# Patient Record
Sex: Male | Born: 1960 | Race: White | Hispanic: No | Marital: Married | Smoking: Former smoker
Health system: Southern US, Community
[De-identification: ages and names within clinical notes are randomized; demographics above are authoritative.]

## PROBLEM LIST (undated history)

## (undated) DIAGNOSIS — K219 Gastro-esophageal reflux disease without esophagitis: Secondary | ICD-10-CM

## (undated) DIAGNOSIS — G4730 Sleep apnea, unspecified: Secondary | ICD-10-CM

## (undated) DIAGNOSIS — J189 Pneumonia, unspecified organism: Secondary | ICD-10-CM

## (undated) DIAGNOSIS — E119 Type 2 diabetes mellitus without complications: Secondary | ICD-10-CM

## (undated) DIAGNOSIS — R7303 Prediabetes: Secondary | ICD-10-CM

## (undated) DIAGNOSIS — G4700 Insomnia, unspecified: Secondary | ICD-10-CM

## (undated) DIAGNOSIS — Z87891 Personal history of nicotine dependence: Secondary | ICD-10-CM

## (undated) DIAGNOSIS — R739 Hyperglycemia, unspecified: Secondary | ICD-10-CM

## (undated) DIAGNOSIS — M549 Dorsalgia, unspecified: Secondary | ICD-10-CM

## (undated) DIAGNOSIS — R7989 Other specified abnormal findings of blood chemistry: Secondary | ICD-10-CM

## (undated) DIAGNOSIS — C801 Malignant (primary) neoplasm, unspecified: Secondary | ICD-10-CM

## (undated) DIAGNOSIS — I10 Essential (primary) hypertension: Secondary | ICD-10-CM

## (undated) HISTORY — DX: Type 2 diabetes mellitus without complications: E11.9

## (undated) HISTORY — DX: Hyperglycemia, unspecified: R73.9

## (undated) HISTORY — DX: Insomnia, unspecified: G47.00

## (undated) HISTORY — PX: TONSILLECTOMY: SUR1361

## (undated) HISTORY — DX: Other specified abnormal findings of blood chemistry: R79.89

## (undated) HISTORY — PX: NO PAST SURGERIES: SHX2092

---

## 2000-05-22 ENCOUNTER — Ambulatory Visit (HOSPITAL_COMMUNITY): Admission: RE | Admit: 2000-05-22 | Discharge: 2000-05-22 | Payer: Self-pay | Admitting: Orthopedic Surgery

## 2000-05-22 ENCOUNTER — Encounter: Payer: Self-pay | Admitting: Orthopedic Surgery

## 2005-01-21 ENCOUNTER — Ambulatory Visit: Payer: Self-pay | Admitting: Family Medicine

## 2005-02-06 ENCOUNTER — Ambulatory Visit: Payer: Self-pay | Admitting: Family Medicine

## 2005-03-08 ENCOUNTER — Ambulatory Visit: Payer: Self-pay | Admitting: Family Medicine

## 2005-03-12 ENCOUNTER — Ambulatory Visit: Payer: Self-pay | Admitting: Family Medicine

## 2015-09-13 DIAGNOSIS — H5010 Unspecified exotropia: Secondary | ICD-10-CM

## 2015-09-13 DIAGNOSIS — H50112 Monocular exotropia, left eye: Secondary | ICD-10-CM

## 2015-09-13 DIAGNOSIS — H31013 Macula scars of posterior pole (postinflammatory) (post-traumatic), bilateral: Secondary | ICD-10-CM | POA: Insufficient documentation

## 2015-09-13 HISTORY — DX: Macula scars of posterior pole (postinflammatory) (post-traumatic), bilateral: H31.013

## 2015-09-13 HISTORY — DX: Monocular exotropia, left eye: H50.112

## 2015-09-13 HISTORY — DX: Unspecified exotropia: H50.10

## 2019-02-23 DIAGNOSIS — M5416 Radiculopathy, lumbar region: Secondary | ICD-10-CM

## 2019-02-23 HISTORY — DX: Radiculopathy, lumbar region: M54.16

## 2019-08-06 ENCOUNTER — Encounter: Payer: Self-pay | Admitting: Cardiology

## 2019-08-09 LAB — PROTIME-INR

## 2019-08-13 DIAGNOSIS — F4329 Adjustment disorder with other symptoms: Secondary | ICD-10-CM

## 2019-08-13 HISTORY — DX: Adjustment disorder with other symptoms: F43.29

## 2019-10-14 DIAGNOSIS — J309 Allergic rhinitis, unspecified: Secondary | ICD-10-CM

## 2019-10-14 HISTORY — DX: Allergic rhinitis, unspecified: J30.9

## 2019-10-22 ENCOUNTER — Inpatient Hospital Stay (HOSPITAL_COMMUNITY)
Admission: EM | Admit: 2019-10-22 | Discharge: 2019-10-26 | DRG: 641 | Disposition: A | Discharge status: Home / Self Care | Payer: Self-pay | Attending: Internal Medicine | Admitting: Internal Medicine

## 2019-10-22 DIAGNOSIS — N131 Hydronephrosis with ureteral stricture, not elsewhere classified: Secondary | ICD-10-CM | POA: Diagnosis present

## 2019-10-22 DIAGNOSIS — M1990 Unspecified osteoarthritis, unspecified site: Secondary | ICD-10-CM | POA: Diagnosis present

## 2019-10-22 DIAGNOSIS — F419 Anxiety disorder, unspecified: Secondary | ICD-10-CM | POA: Diagnosis present

## 2019-10-22 DIAGNOSIS — E872 Acidosis, unspecified: Secondary | ICD-10-CM

## 2019-10-22 DIAGNOSIS — R000 Tachycardia, unspecified: Secondary | ICD-10-CM | POA: Diagnosis present

## 2019-10-22 DIAGNOSIS — K047 Periapical abscess without sinus: Secondary | ICD-10-CM

## 2019-10-22 DIAGNOSIS — R42 Dizziness and giddiness: Secondary | ICD-10-CM | POA: Diagnosis present

## 2019-10-22 DIAGNOSIS — Z882 Allergy status to sulfonamides status: Secondary | ICD-10-CM

## 2019-10-22 DIAGNOSIS — R6510 Systemic inflammatory response syndrome (SIRS) of non-infectious origin without acute organ dysfunction: Secondary | ICD-10-CM | POA: Diagnosis present

## 2019-10-22 DIAGNOSIS — E876 Hypokalemia: Secondary | ICD-10-CM | POA: Diagnosis present

## 2019-10-22 DIAGNOSIS — K029 Dental caries, unspecified: Secondary | ICD-10-CM | POA: Diagnosis present

## 2019-10-22 DIAGNOSIS — M1712 Unilateral primary osteoarthritis, left knee: Secondary | ICD-10-CM | POA: Diagnosis present

## 2019-10-22 DIAGNOSIS — I10 Essential (primary) hypertension: Secondary | ICD-10-CM | POA: Diagnosis present

## 2019-10-22 DIAGNOSIS — O10019 Pre-existing essential hypertension complicating pregnancy, unspecified trimester: Secondary | ICD-10-CM | POA: Diagnosis present

## 2019-10-22 DIAGNOSIS — Z87891 Personal history of nicotine dependence: Secondary | ICD-10-CM

## 2019-10-22 DIAGNOSIS — Z23 Encounter for immunization: Secondary | ICD-10-CM

## 2019-10-22 DIAGNOSIS — T502X5A Adverse effect of carbonic-anhydrase inhibitors, benzothiadiazides and other diuretics, initial encounter: Secondary | ICD-10-CM | POA: Diagnosis present

## 2019-10-22 DIAGNOSIS — Z79899 Other long term (current) drug therapy: Secondary | ICD-10-CM

## 2019-10-22 DIAGNOSIS — Z880 Allergy status to penicillin: Secondary | ICD-10-CM

## 2019-10-22 DIAGNOSIS — Z20822 Contact with and (suspected) exposure to covid-19: Secondary | ICD-10-CM | POA: Diagnosis present

## 2019-10-22 HISTORY — DX: Dorsalgia, unspecified: M54.9

## 2019-10-22 HISTORY — DX: Essential (primary) hypertension: I10

## 2019-10-22 HISTORY — DX: Personal history of nicotine dependence: Z87.891

## 2019-10-23 ENCOUNTER — Observation Stay (HOSPITAL_COMMUNITY): Payer: Self-pay

## 2019-10-23 ENCOUNTER — Observation Stay (HOSPITAL_BASED_OUTPATIENT_CLINIC_OR_DEPARTMENT_OTHER): Payer: Self-pay

## 2019-10-23 ENCOUNTER — Emergency Department (HOSPITAL_COMMUNITY): Payer: Self-pay

## 2019-10-23 ENCOUNTER — Other Ambulatory Visit: Payer: Self-pay

## 2019-10-23 ENCOUNTER — Encounter (HOSPITAL_COMMUNITY): Payer: Self-pay | Admitting: Internal Medicine

## 2019-10-23 DIAGNOSIS — O10019 Pre-existing essential hypertension complicating pregnancy, unspecified trimester: Secondary | ICD-10-CM | POA: Diagnosis present

## 2019-10-23 DIAGNOSIS — R6510 Systemic inflammatory response syndrome (SIRS) of non-infectious origin without acute organ dysfunction: Secondary | ICD-10-CM | POA: Diagnosis present

## 2019-10-23 DIAGNOSIS — R42 Dizziness and giddiness: Secondary | ICD-10-CM

## 2019-10-23 DIAGNOSIS — R002 Palpitations: Secondary | ICD-10-CM

## 2019-10-23 DIAGNOSIS — E872 Acidosis, unspecified: Secondary | ICD-10-CM | POA: Diagnosis present

## 2019-10-23 DIAGNOSIS — F419 Anxiety disorder, unspecified: Secondary | ICD-10-CM

## 2019-10-23 DIAGNOSIS — M1990 Unspecified osteoarthritis, unspecified site: Secondary | ICD-10-CM | POA: Diagnosis present

## 2019-10-23 DIAGNOSIS — E876 Hypokalemia: Secondary | ICD-10-CM

## 2019-10-23 DIAGNOSIS — R000 Tachycardia, unspecified: Secondary | ICD-10-CM

## 2019-10-23 DIAGNOSIS — M1711 Unilateral primary osteoarthritis, right knee: Secondary | ICD-10-CM

## 2019-10-23 DIAGNOSIS — I10 Essential (primary) hypertension: Secondary | ICD-10-CM | POA: Diagnosis present

## 2019-10-23 HISTORY — DX: Anxiety disorder, unspecified: F41.9

## 2019-10-23 HISTORY — DX: Dizziness and giddiness: R42

## 2019-10-23 HISTORY — DX: Acidosis: E87.2

## 2019-10-23 HISTORY — DX: Tachycardia, unspecified: R00.0

## 2019-10-23 HISTORY — DX: Hypokalemia: E87.6

## 2019-10-23 HISTORY — DX: Systemic inflammatory response syndrome (sirs) of non-infectious origin without acute organ dysfunction: R65.10

## 2019-10-23 HISTORY — DX: Pre-existing essential hypertension complicating pregnancy, unspecified trimester: O10.019

## 2019-10-23 HISTORY — DX: Acidosis, unspecified: E87.20

## 2019-10-23 HISTORY — DX: Essential (primary) hypertension: I10

## 2019-10-23 HISTORY — DX: Unspecified osteoarthritis, unspecified site: M19.90

## 2019-10-23 LAB — COMPREHENSIVE METABOLIC PANEL
ALT: 63 U/L — ABNORMAL HIGH (ref 0–44)
AST: 35 U/L (ref 15–41)
Albumin: 4.4 g/dL (ref 3.5–5.0)
Alkaline Phosphatase: 71 U/L (ref 38–126)
Anion gap: 16 — ABNORMAL HIGH (ref 5–15)
BUN: 13 mg/dL (ref 6–20)
CO2: 19 mmol/L — ABNORMAL LOW (ref 22–32)
Calcium: 10 mg/dL (ref 8.9–10.3)
Chloride: 102 mmol/L (ref 98–111)
Creatinine, Ser: 1.24 mg/dL (ref 0.61–1.24)
GFR calc Af Amer: >60 mL/min (ref >60)
GFR calc non Af Amer: >60 mL/min (ref >60)
Glucose, Bld: 156 mg/dL — ABNORMAL HIGH (ref 70–99)
Potassium: 3.4 mmol/L — ABNORMAL LOW (ref 3.5–5.1)
Sodium: 137 mmol/L (ref 135–145)
Total Bilirubin: 1 mg/dL (ref 0.3–1.2)
Total Protein: 7.7 g/dL (ref 6.5–8.1)

## 2019-10-23 LAB — TROPONIN I (HIGH SENSITIVITY)
Troponin I (High Sensitivity): 12 ng/L (ref <18)
Troponin I (High Sensitivity): 15 ng/L (ref <18)

## 2019-10-23 LAB — RAPID URINE DRUG SCREEN, HOSP PERFORMED
Amphetamines: NOT DETECTED
Barbiturates: NOT DETECTED
Benzodiazepines: NOT DETECTED
Cocaine: NOT DETECTED
Opiates: NOT DETECTED
Tetrahydrocannabinol: NOT DETECTED

## 2019-10-23 LAB — URINALYSIS, ROUTINE W REFLEX MICROSCOPIC
Bacteria, UA: NONE SEEN
Bilirubin Urine: NEGATIVE
Glucose, UA: NEGATIVE mg/dL
Hgb urine dipstick: NEGATIVE
Ketones, ur: NEGATIVE mg/dL
Leukocytes,Ua: NEGATIVE
Nitrite: NEGATIVE
Protein, ur: 100 mg/dL — AB
Specific Gravity, Urine: 1.009 (ref 1.005–1.030)
pH: 7 (ref 5.0–8.0)

## 2019-10-23 LAB — MRSA PCR SCREENING: MRSA by PCR: NEGATIVE

## 2019-10-23 LAB — C-REACTIVE PROTEIN: CRP: 1.5 mg/dL — ABNORMAL HIGH (ref <1.0)

## 2019-10-23 LAB — D-DIMER, QUANTITATIVE: D-Dimer, Quant: 0.5 ug/mL-FEU (ref 0.00–0.50)

## 2019-10-23 LAB — ECHOCARDIOGRAM COMPLETE
AR max vel: 2.94 cm2
AV Area VTI: 2.77 cm2
AV Area mean vel: 2.81 cm2
AV Mean grad: 3 mmHg
AV Peak grad: 6.3 mmHg
Ao pk vel: 1.25 m/s
S' Lateral: 1.9 cm

## 2019-10-23 LAB — MAGNESIUM: Magnesium: 1.4 mg/dL — ABNORMAL LOW (ref 1.7–2.4)

## 2019-10-23 LAB — CBC
HCT: 50.5 % (ref 39.0–52.0)
Hemoglobin: 17.9 g/dL — ABNORMAL HIGH (ref 13.0–17.0)
MCH: 30.4 pg (ref 26.0–34.0)
MCHC: 35.4 g/dL (ref 30.0–36.0)
MCV: 85.7 fL (ref 80.0–100.0)
Platelets: 350 10*3/uL (ref 150–400)
RBC: 5.89 MIL/uL — ABNORMAL HIGH (ref 4.22–5.81)
RDW: 11.6 % (ref 11.5–15.5)
WBC: 16.4 10*3/uL — ABNORMAL HIGH (ref 4.0–10.5)
nRBC: 0 % (ref 0.0–0.2)

## 2019-10-23 LAB — LACTIC ACID, PLASMA
Lactic Acid, Venous: 3 mmol/L (ref 0.5–1.9)
Lactic Acid, Venous: 2.8 mmol/L (ref 0.5–1.9)

## 2019-10-23 LAB — T4, FREE: Free T4: 0.98 ng/dL (ref 0.61–1.12)

## 2019-10-23 LAB — LIPASE, BLOOD: Lipase: 38 U/L (ref 11–51)

## 2019-10-23 LAB — SALICYLATE LEVEL: Salicylate Lvl: <7 mg/dL — ABNORMAL LOW (ref 7.0–30.0)

## 2019-10-23 LAB — TSH: TSH: 1.305 u[IU]/mL (ref 0.350–4.500)

## 2019-10-23 LAB — HIV ANTIBODY (ROUTINE TESTING W REFLEX): HIV Screen 4th Generation wRfx: NONREACTIVE

## 2019-10-23 LAB — SEDIMENTATION RATE: Sed Rate: 8 mm/hr (ref 0–16)

## 2019-10-23 LAB — SARS CORONAVIRUS 2 BY RT PCR (HOSPITAL ORDER, PERFORMED IN ~~LOC~~ HOSPITAL LAB): SARS Coronavirus 2: NEGATIVE

## 2019-10-23 IMAGING — CT CT ABD-PELV W/ CM
2 of 5 series · 15 of 46 positions shown, 17 images · IV contrast (omnipaque)
Comparison: CT angio chest [DATE]

CLINICAL DATA: Abdominal pain, acute. Near syncope, dizziness and
palpitations.

EXAM:
CT ANGIOGRAPHY CHEST
CT ABDOMEN AND PELVIS WITH CONTRAST
TECHNIQUE: Multidetector CT imaging of the chest was performed using the
standard protocol during bolus administration of intravenous
contrast. Multiplanar CT image reconstructions and MIPs were
obtained to evaluate the vascular anatomy. Multidetector CT imaging
of the abdomen and pelvis was performed using the standard protocol
during bolus administration of intravenous contrast.
CONTRAST:  80mL OMNIPAQUE IOHEXOL 350 MG/ML SOLN

[Series 5: a/p w/ 5mm · axial · 0.80mm/px · z∈[+761,+1211]mm · 12 of 104 slices shown, 14 images]
[im 7/104  soft-tissue]
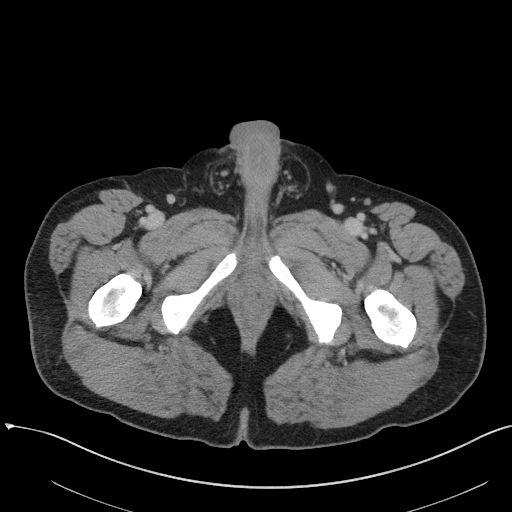
[im 7/104  bone]
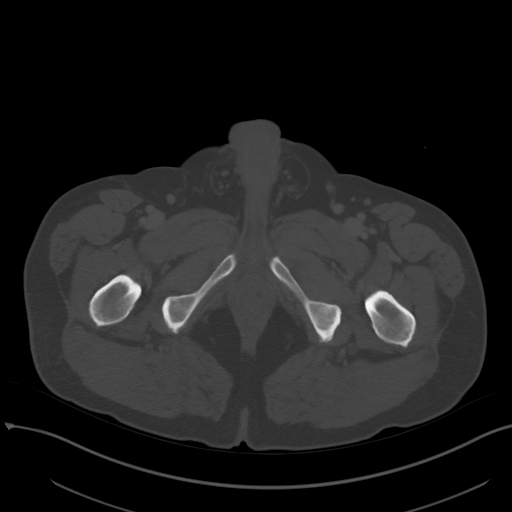
[im 19/104  soft-tissue]
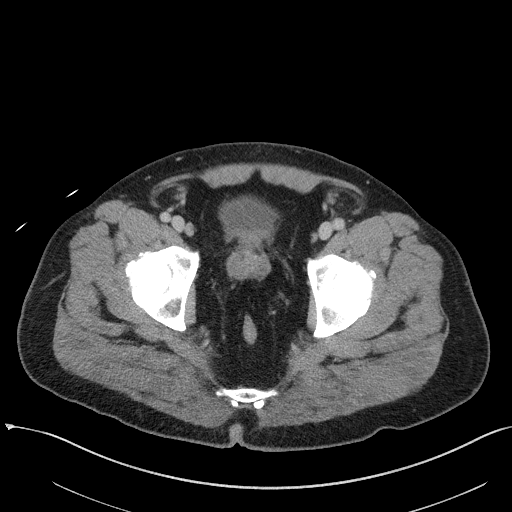
[im 25/104  soft-tissue]
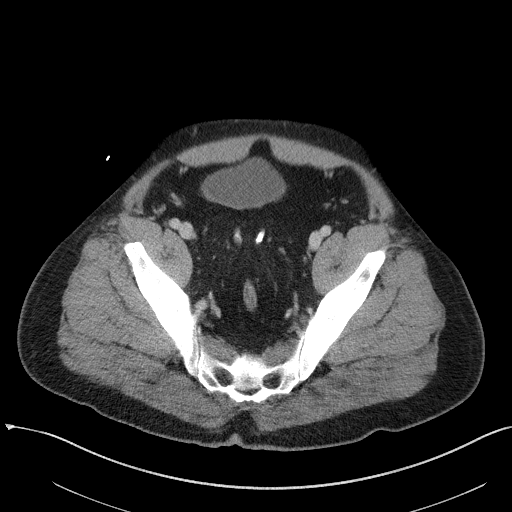
[im 31/104  soft-tissue]
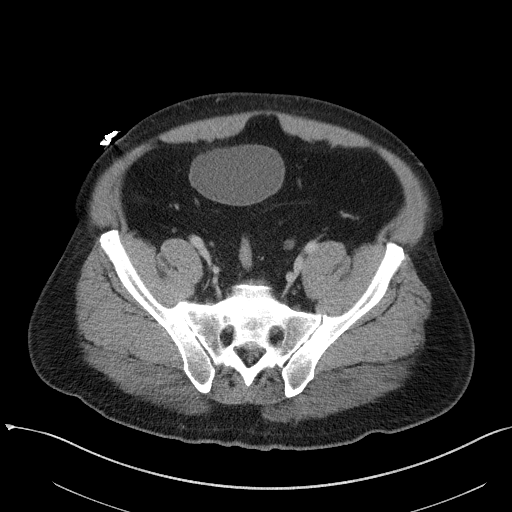
[im 43/104  soft-tissue]
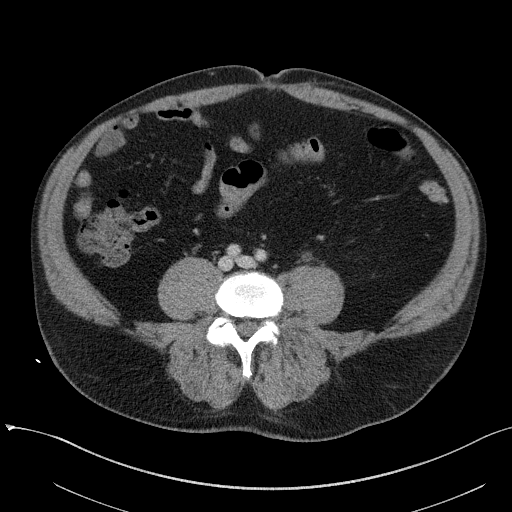
[im 49/104  soft-tissue]
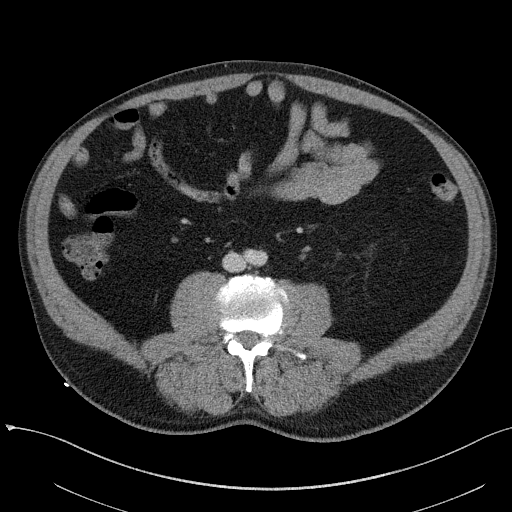
[im 55/104  soft-tissue]
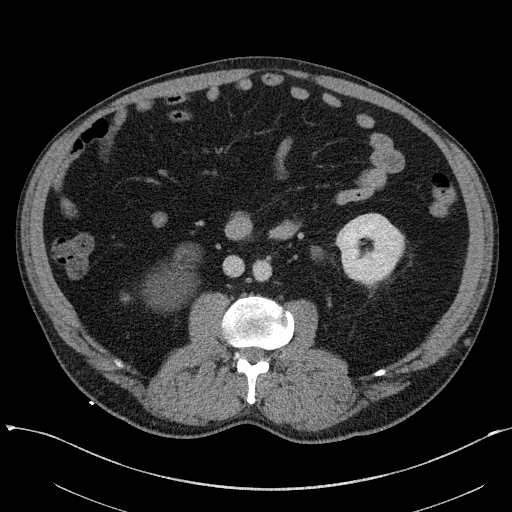
[im 67/104  soft-tissue]
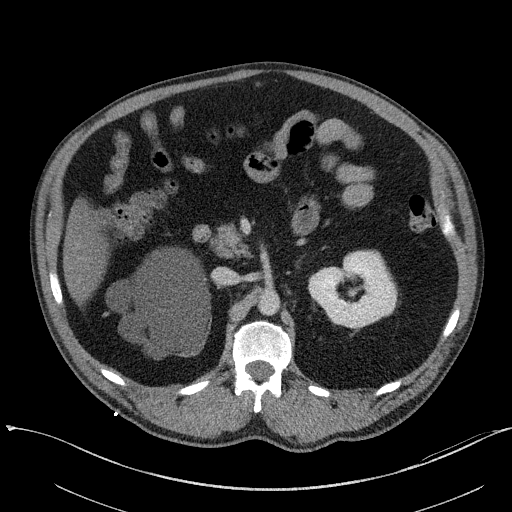
[im 73/104  soft-tissue]
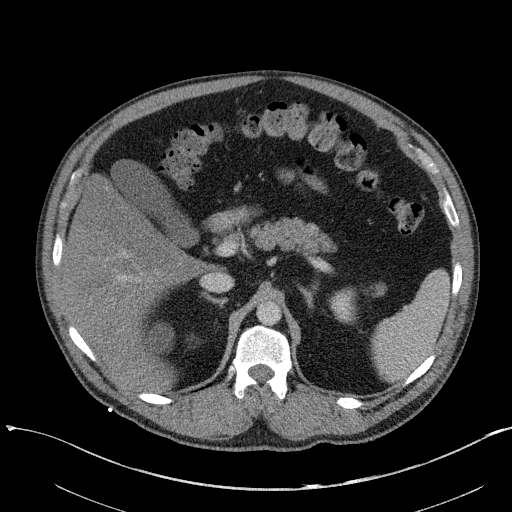
[im 73/104  bone]
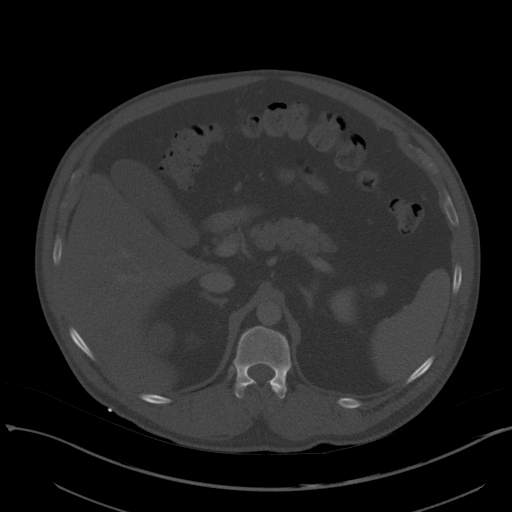
[im 79/104  soft-tissue]
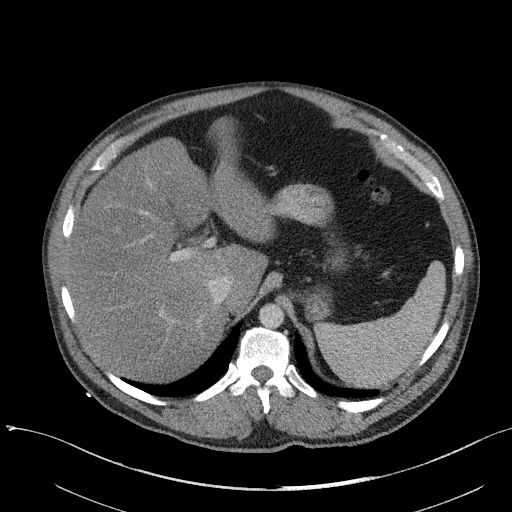
[im 91/104  soft-tissue]
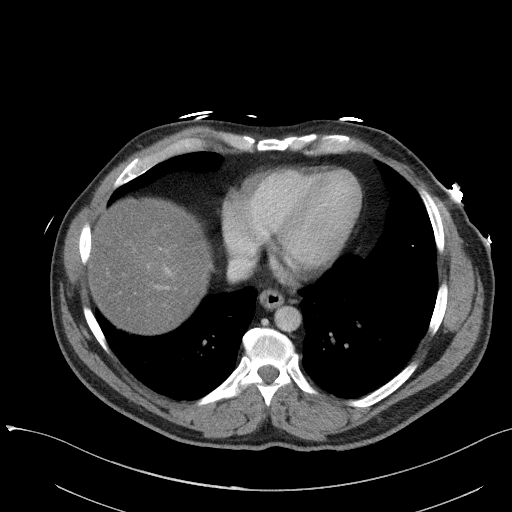
[im 97/104  soft-tissue]
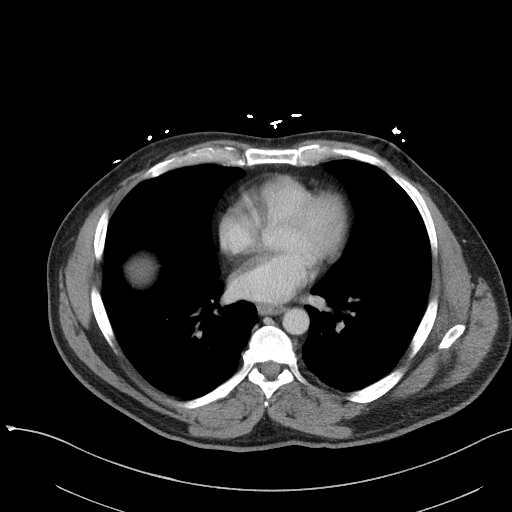

[Series 8: a/p w/ cor · coronal · 0.77mm/px · 3 of 151 slices shown]
[im 51/151  soft-tissue]
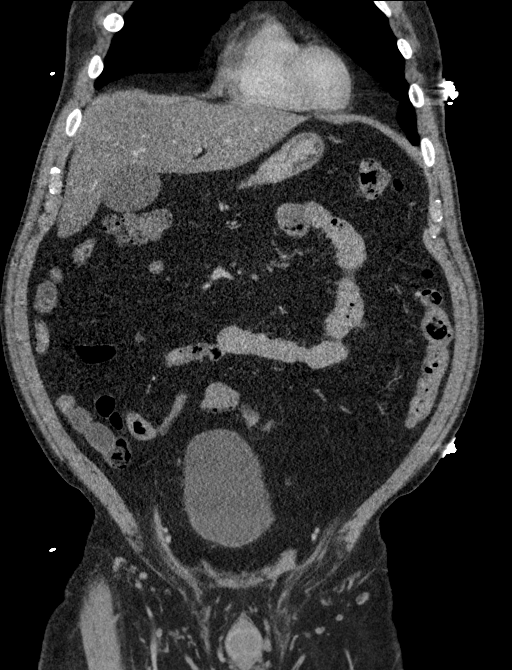
[im 67/151  soft-tissue]
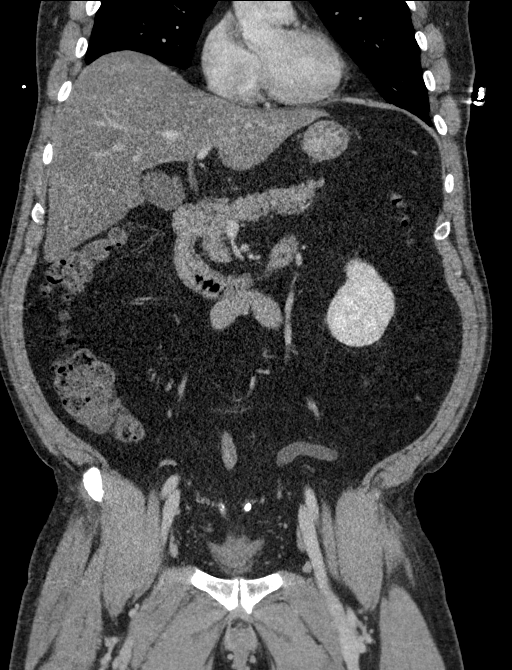
[im 84/151  soft-tissue]
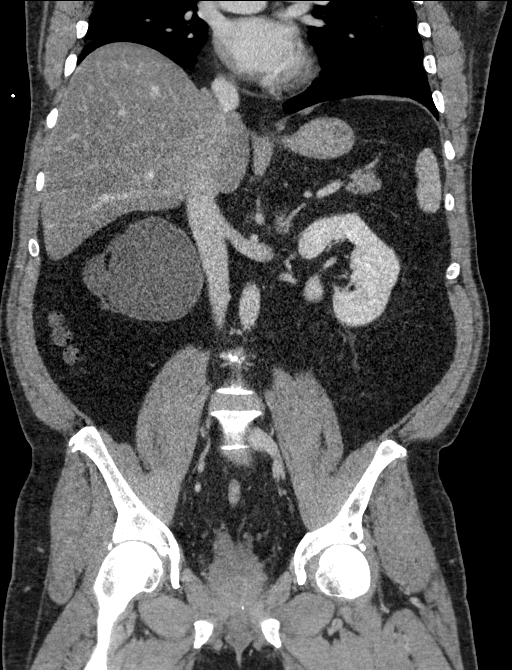

[15 of 46 positions shown; findings below may reference images not displayed]

FINDINGS: CTA CHEST FINDINGS

Cardiovascular: The main pulmonary artery appears patent. No saddle
embolus. No lobar or segmental pulmonary artery filling defects
identified.

Normal heart size.

Mediastinum/Nodes: No enlarged mediastinal, hilar, or axillary lymph
nodes. Thyroid gland, trachea, and esophagus demonstrate no
significant findings.

Lungs/Pleura: No pleural effusion identified. Mild mosaic
attenuation pattern is identified bilaterally. No airspace
consolidation identified.

Musculoskeletal: No acute or suspicious osseous findings.

Review of the MIP images confirms the above findings.

CT ABDOMEN and PELVIS FINDINGS

Hepatobiliary: Hepatic steatosis. No focal liver abnormality
identified. Gallbladder is unremarkable. No bile duct dilatation.

Pancreas: Unremarkable. No pancreatic ductal dilatation or
surrounding inflammatory changes.

Spleen: Normal in size without focal abnormality.

Adrenals/Urinary Tract: Normal appearance of the adrenal glands.
Prominent left kidney extrarenal pelvis noted. No left kidney
hydronephrosis or mass identified. Mild left hydroureter is
identified. No left ureteral lithiasis or mass identified. There are
signs of marked right hydronephrosis to the level of the UPJ with
significant atrophy of normal renal parenchyma. Normal caliber right
ureter. Urinary bladder appears unremarkable.

Stomach/Bowel: The stomach appears nondistended. The appendix is
visualized and appears normal. There is no bowel wall thickening,
inflammation or distension.

Vascular/Lymphatic: Normal appearance of the abdominal aorta. No
aneurysm. No abdominopelvic adenopathy identified.

Reproductive: Prostate is unremarkable.

Other: No free fluid or fluid collections.

Musculoskeletal: No acute or significant osseous findings. Lumbar
degenerative disc disease.

Review of the MIP images confirms the above findings.
IMPRESSION: 1. No evidence for acute pulmonary embolus.
2. Mild mosaic attenuation pattern identified bilaterally which may
reflect small airways disease.
3. Marked right hydronephrosis to the level of the UPJ with
significant atrophy of normal renal parenchyma. Findings are favored
to represent sequelae of chronic UPJ obstruction, which may be
congenital or acquired.
4. Hepatic steatosis.

## 2019-10-23 IMAGING — DX DG CHEST 1V PORT
1 series · 2 of 2 positions shown · non-contrast
Comparison: [DATE]

CLINICAL DATA: Dizziness, palpitations, near syncope

EXAM:
PORTABLE CHEST 1 VIEW

[Series 1: chest ap · 0.14mm/px · 2 of 2 slices shown]
[im 1/2]
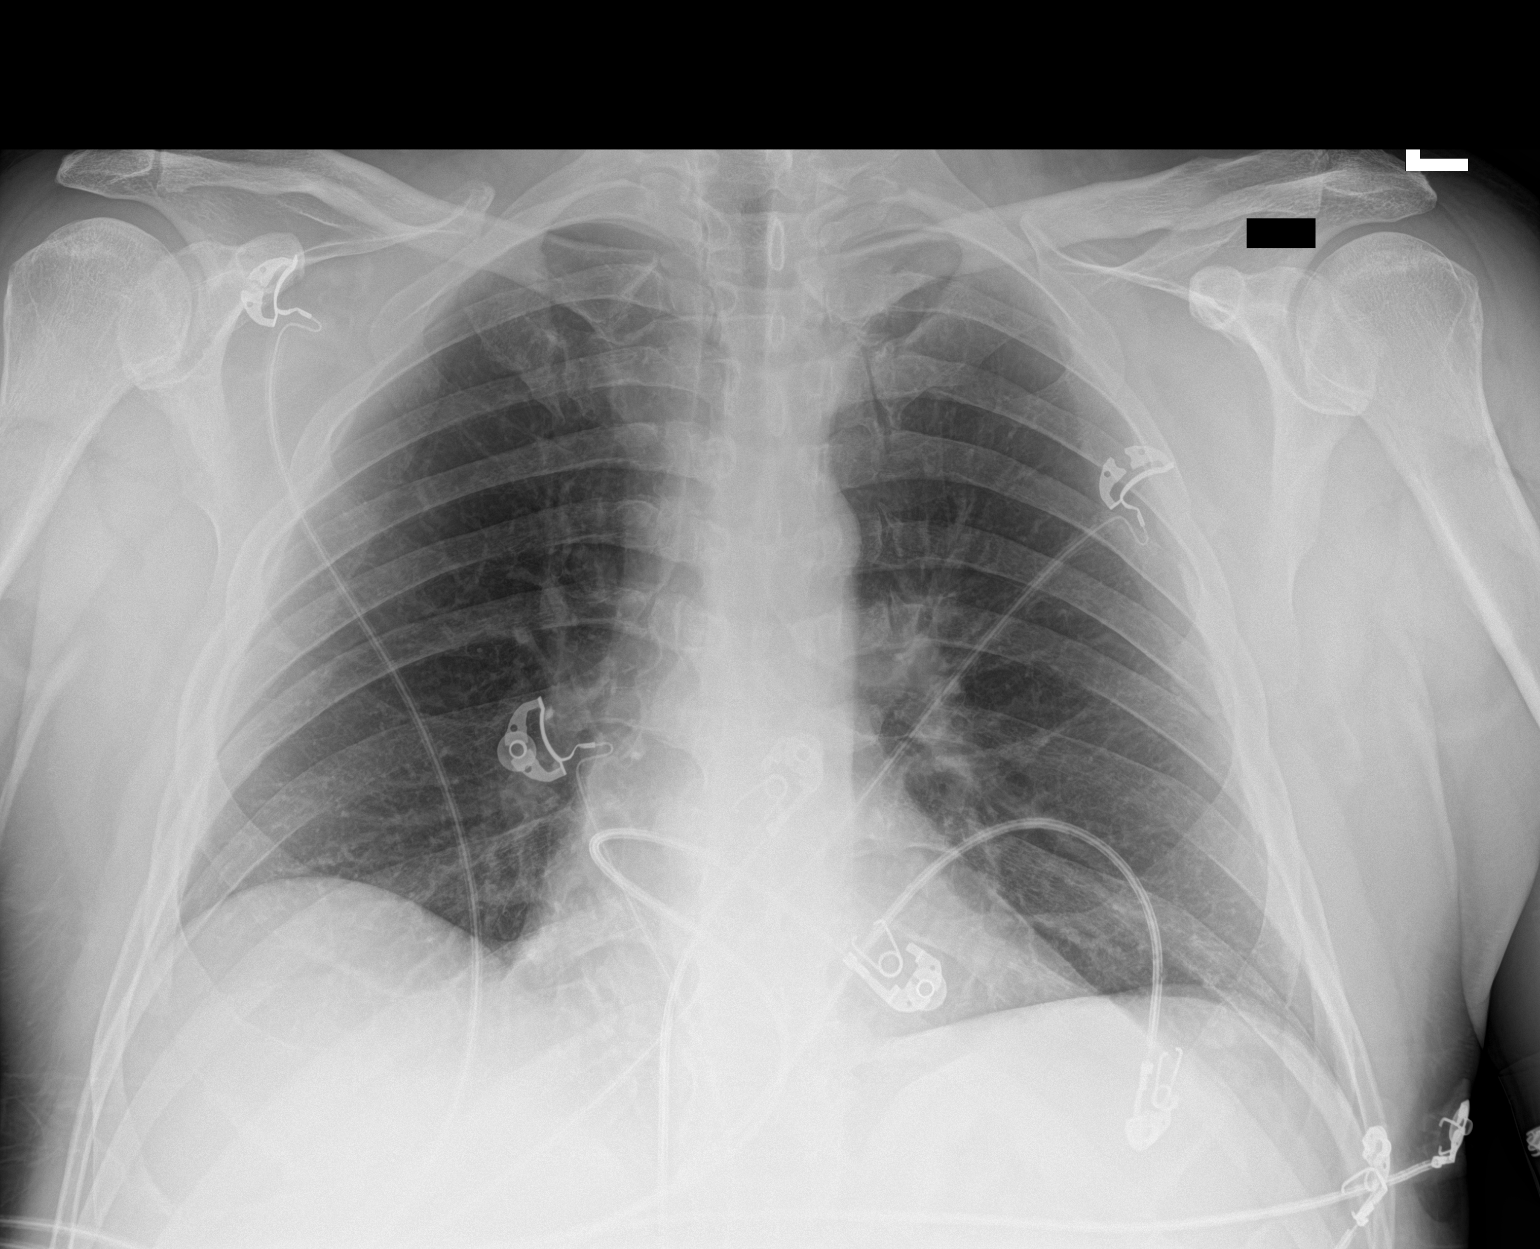
[im 2/2]
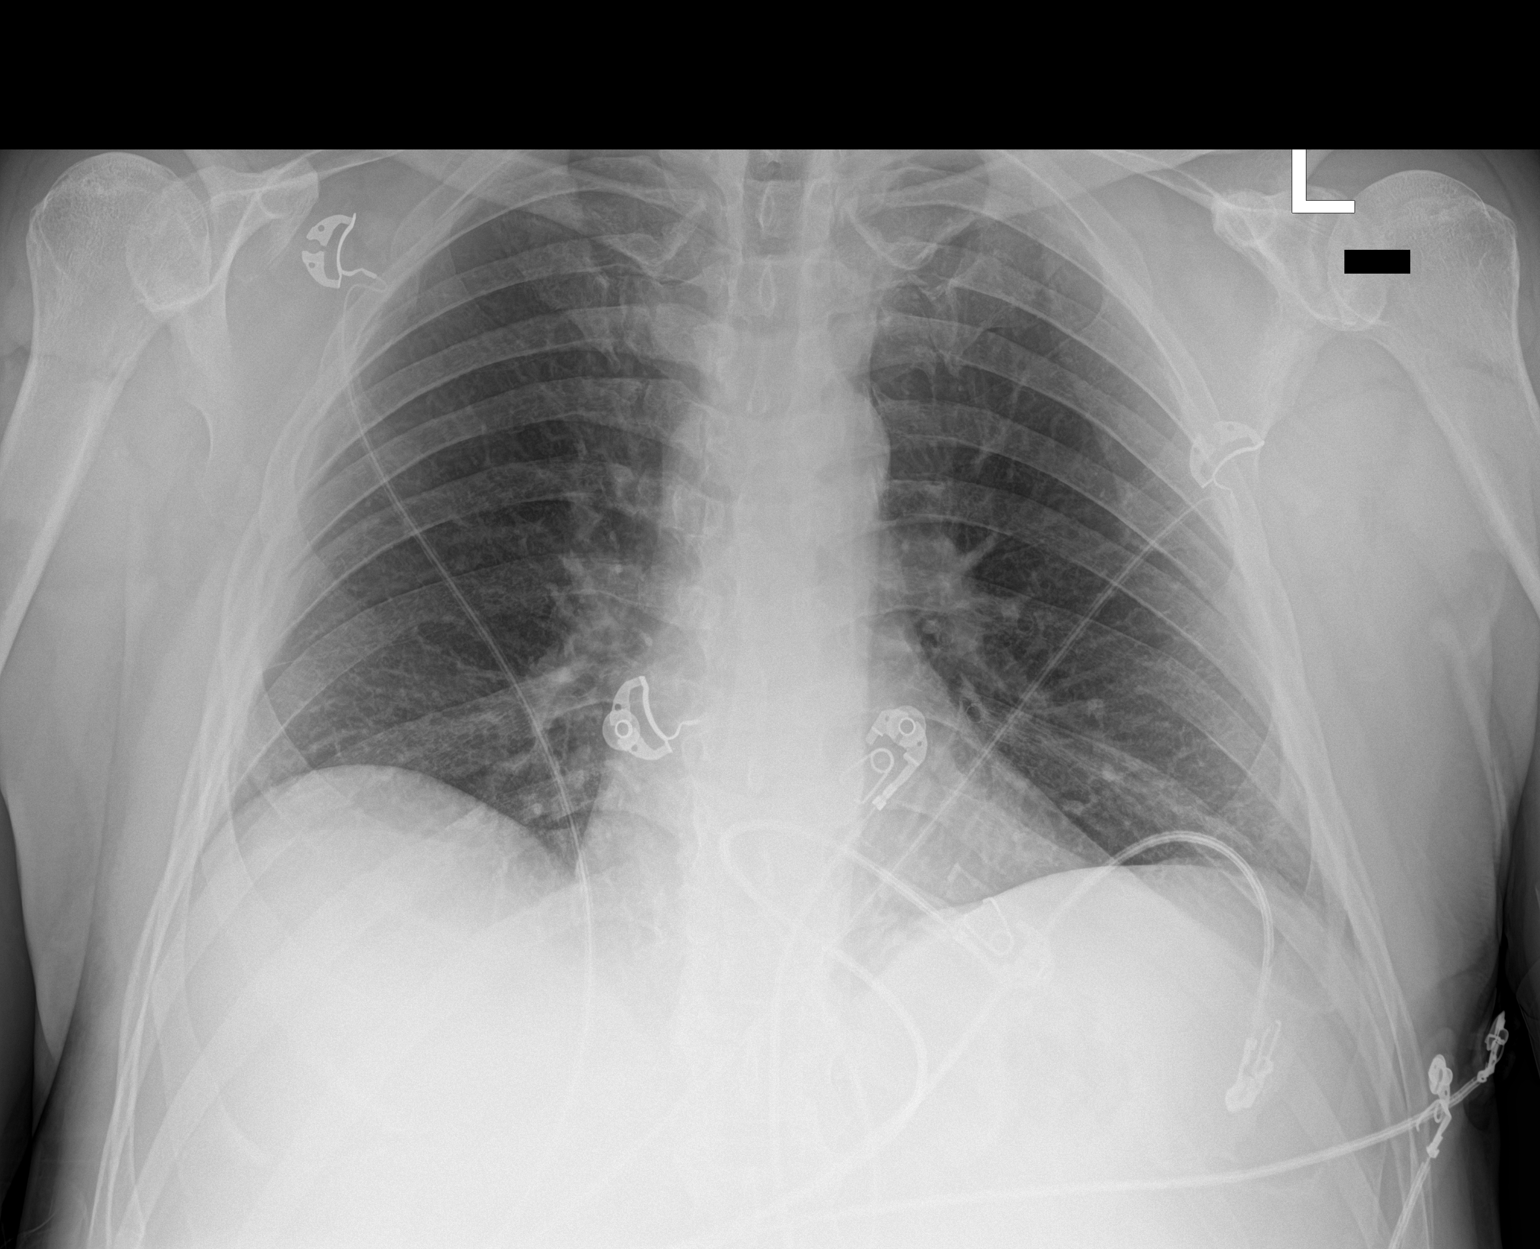

[2 of 2 positions shown; findings below may reference images not displayed]

FINDINGS: Two frontal views of the chest demonstrate an unremarkable cardiac
silhouette. No airspace disease, effusion, or pneumothorax. No acute
bony abnormalities.
IMPRESSION: 1. No acute intrathoracic process.

## 2019-10-23 IMAGING — DX DG ORTHOPANTOGRAM /PANORAMIC
1 series · 1 of 1 positions shown · non-contrast
Comparison: None.

CLINICAL DATA: Dental infection

EXAM:
ORTHOPANTOGRAM/PANORAMIC

[view not recorded]
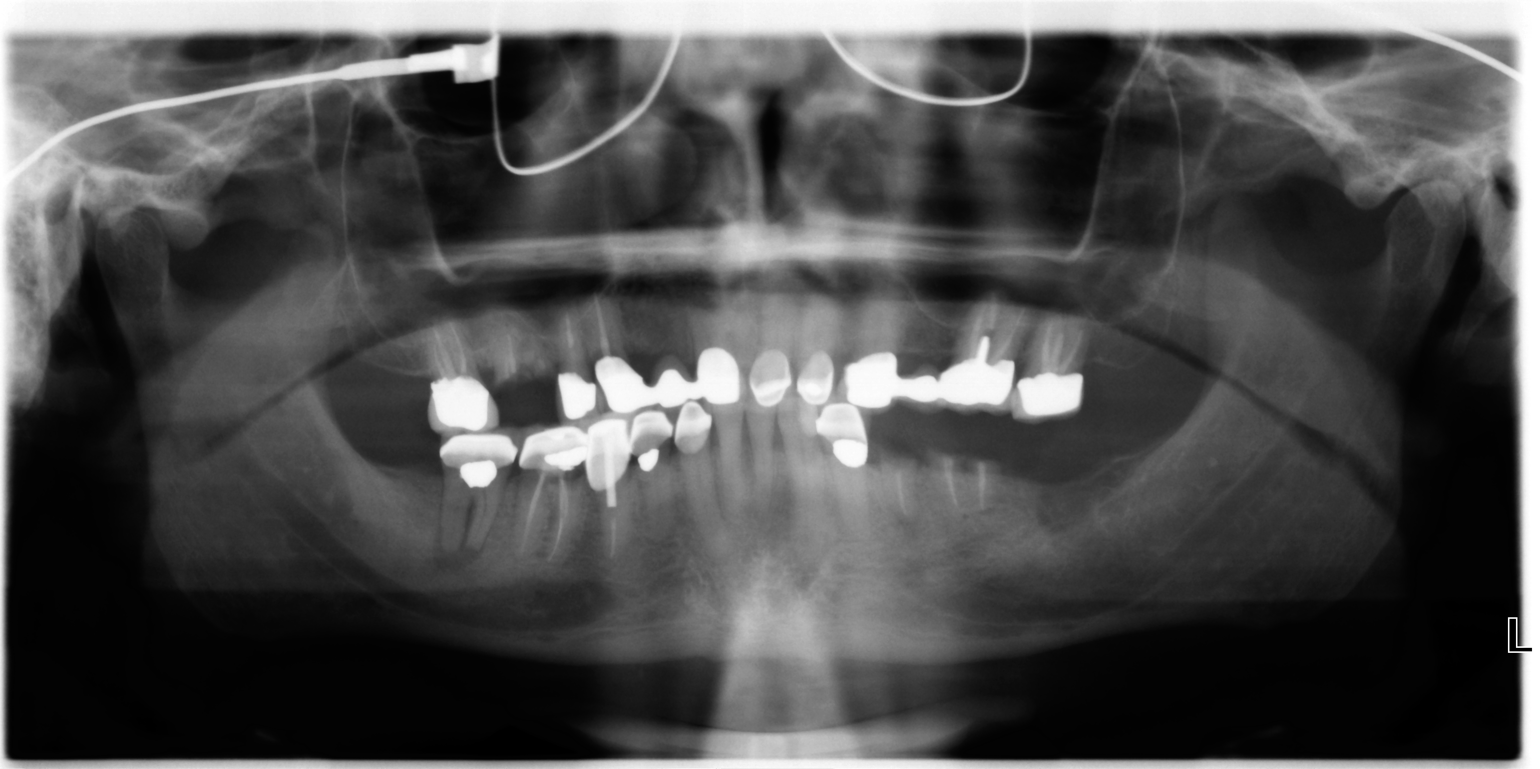

[1 of 1 positions shown; findings below may reference images not displayed]

FINDINGS: Panorex view of the mandible demonstrates erosive changes of the
right upper molar tooth and left lower premolar and molar. No bony
abnormalities.
IMPRESSION: 1. Erosive changes of the right upper molar and left lower premolar
and molar.

## 2019-10-23 IMAGING — CT CT ANGIO CHEST
2 of 6 series · 17 of 36 positions shown · IV contrast (omnipaque)
Comparison: CT angio chest [DATE]

CLINICAL DATA: Abdominal pain, acute. Near syncope, dizziness and
palpitations.

EXAM:
CT ANGIOGRAPHY CHEST
CT ABDOMEN AND PELVIS WITH CONTRAST
TECHNIQUE: Multidetector CT imaging of the chest was performed using the
standard protocol during bolus administration of intravenous
contrast. Multiplanar CT image reconstructions and MIPs were
obtained to evaluate the vascular anatomy. Multidetector CT imaging
of the abdomen and pelvis was performed using the standard protocol
during bolus administration of intravenous contrast.
CONTRAST:  80mL OMNIPAQUE IOHEXOL 350 MG/ML SOLN

[Series 4: pe thins · axial · 0.74mm/px · z∈[+1114,+1346]mm · 16 of 263 slices shown]
[im 15/263  lung]
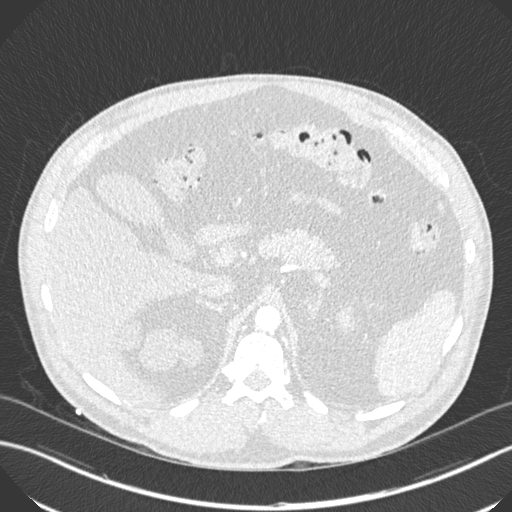
[im 30/263  mediastinal]
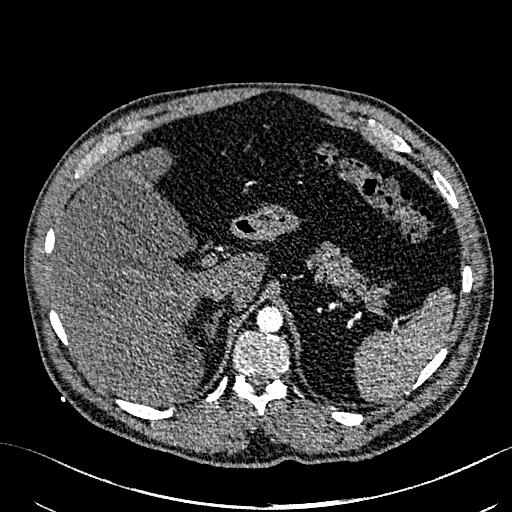
[im 44/263  lung]
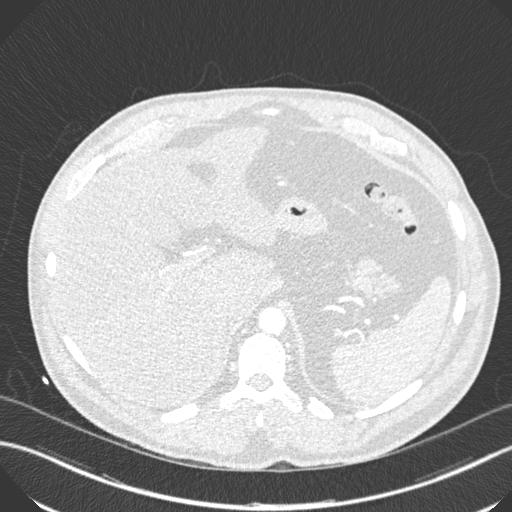
[im 59/263  mediastinal]
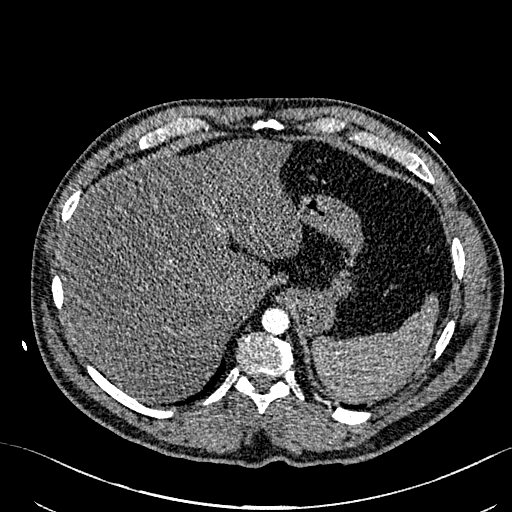
[im 73/263  lung]
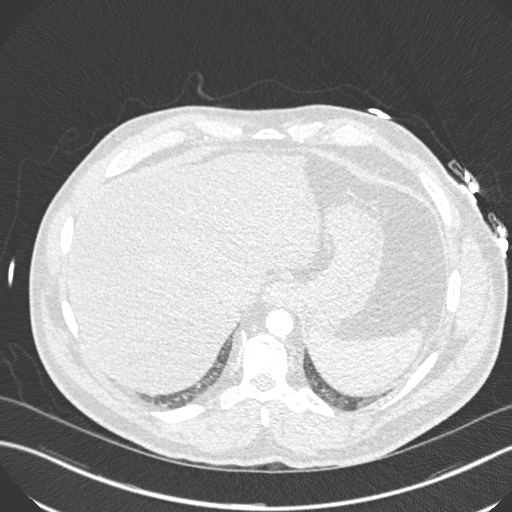
[im 88/263  mediastinal]
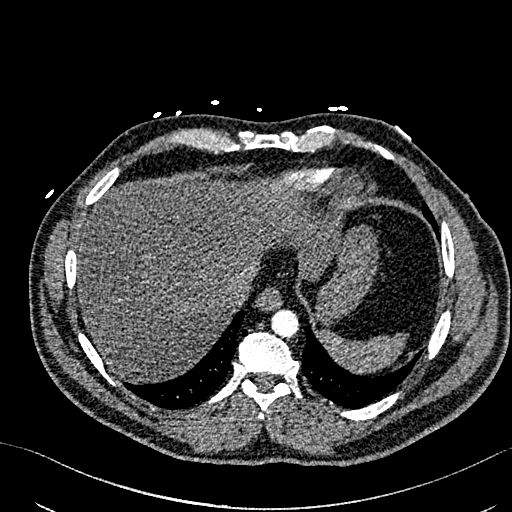
[im 102/263  lung]
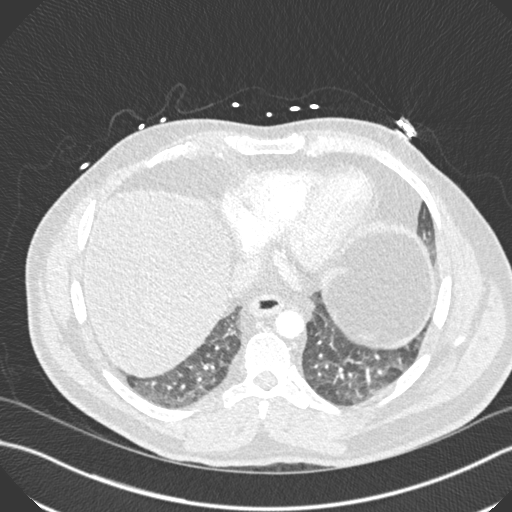
[im 117/263  mediastinal]
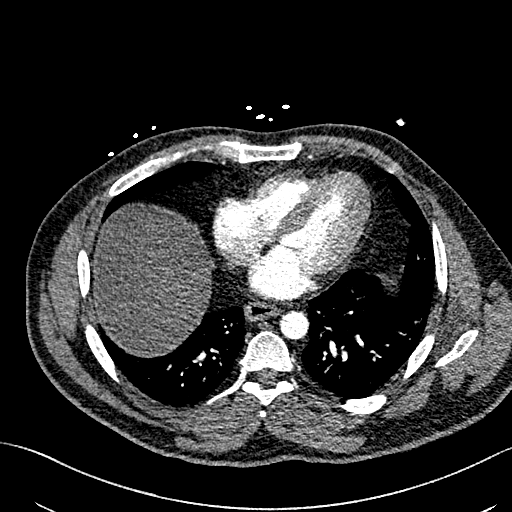
[im 146/263  lung]
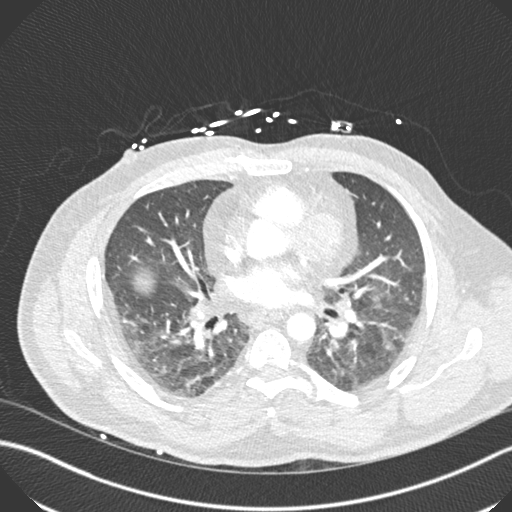
[im 161/263  mediastinal]
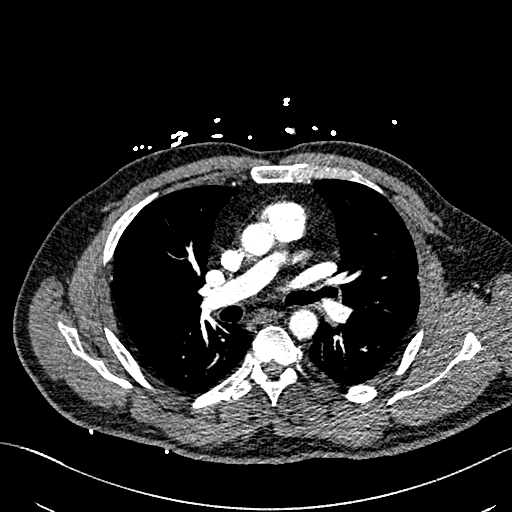
[im 175/263  lung]
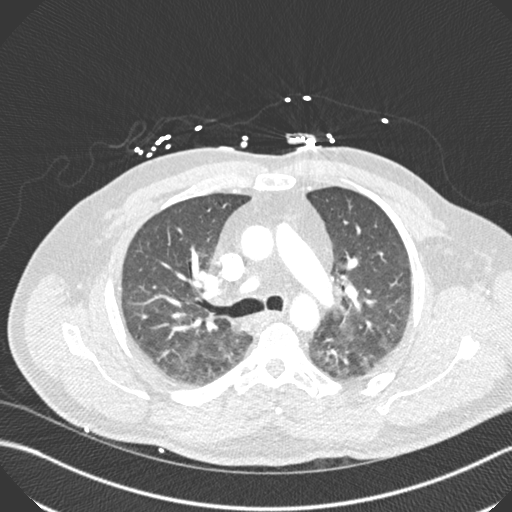
[im 190/263  mediastinal]
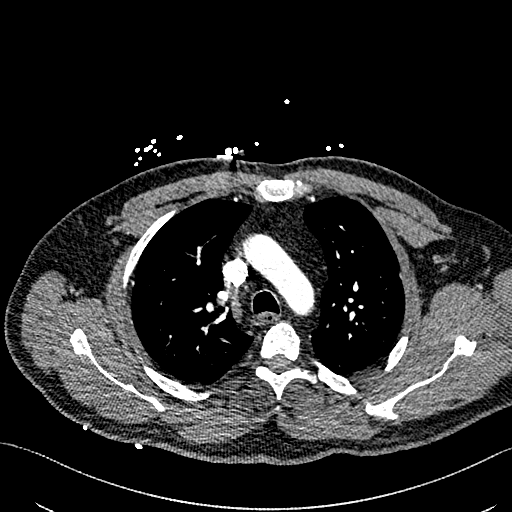
[im 204/263  lung]
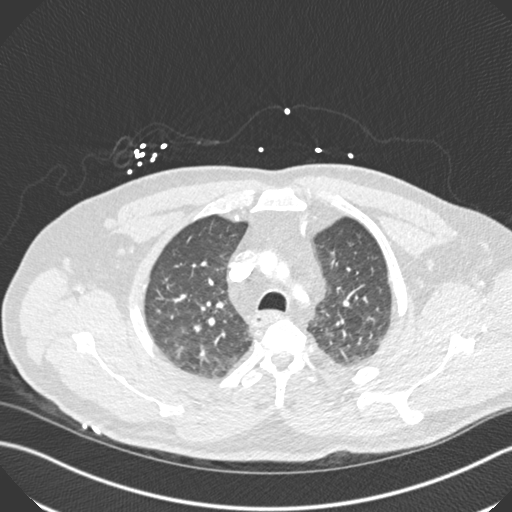
[im 219/263  mediastinal]
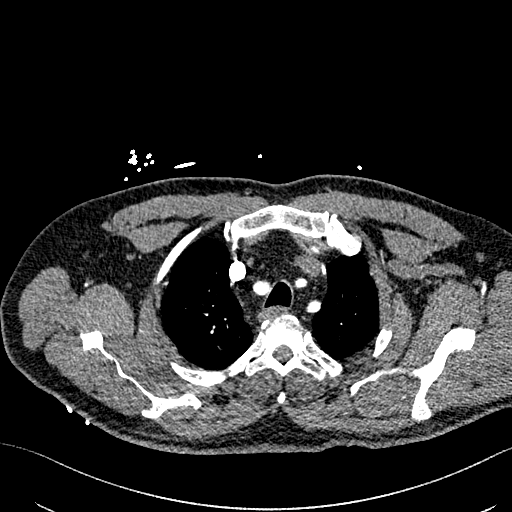
[im 233/263  lung]
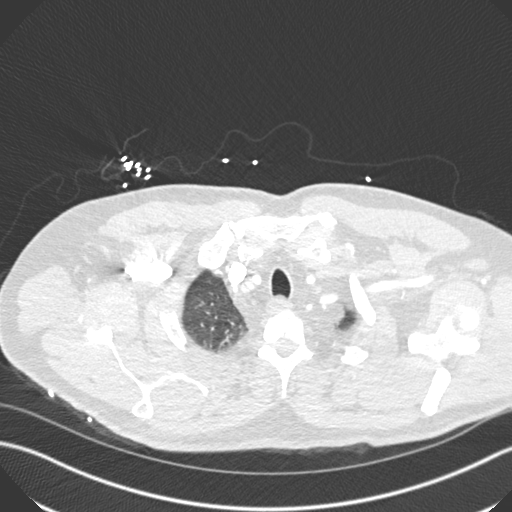
[im 248/263  mediastinal]
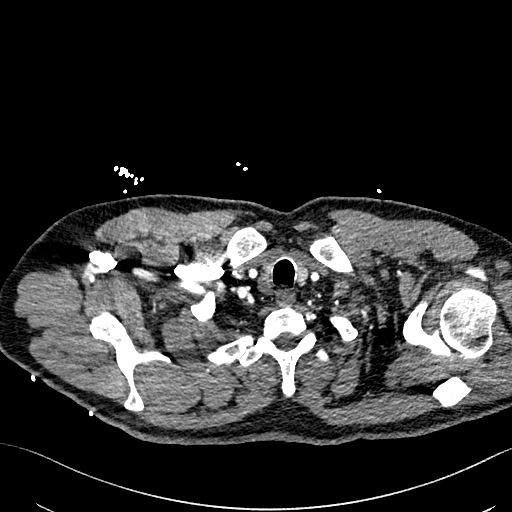

[Series 6: pe 2mm cor · coronal · 0.53mm/px · 1 of 130 slices shown]
[im 65/130  mediastinal]
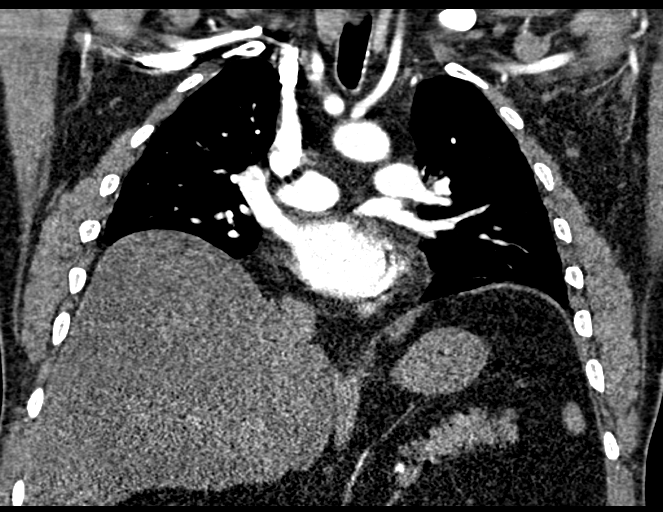

[17 of 36 positions shown; findings below may reference images not displayed]

FINDINGS: CTA CHEST FINDINGS

Cardiovascular: The main pulmonary artery appears patent. No saddle
embolus. No lobar or segmental pulmonary artery filling defects
identified.

Normal heart size.

Mediastinum/Nodes: No enlarged mediastinal, hilar, or axillary lymph
nodes. Thyroid gland, trachea, and esophagus demonstrate no
significant findings.

Lungs/Pleura: No pleural effusion identified. Mild mosaic
attenuation pattern is identified bilaterally. No airspace
consolidation identified.

Musculoskeletal: No acute or suspicious osseous findings.

Review of the MIP images confirms the above findings.

CT ABDOMEN and PELVIS FINDINGS

Hepatobiliary: Hepatic steatosis. No focal liver abnormality
identified. Gallbladder is unremarkable. No bile duct dilatation.

Pancreas: Unremarkable. No pancreatic ductal dilatation or
surrounding inflammatory changes.

Spleen: Normal in size without focal abnormality.

Adrenals/Urinary Tract: Normal appearance of the adrenal glands.
Prominent left kidney extrarenal pelvis noted. No left kidney
hydronephrosis or mass identified. Mild left hydroureter is
identified. No left ureteral lithiasis or mass identified. There are
signs of marked right hydronephrosis to the level of the UPJ with
significant atrophy of normal renal parenchyma. Normal caliber right
ureter. Urinary bladder appears unremarkable.

Stomach/Bowel: The stomach appears nondistended. The appendix is
visualized and appears normal. There is no bowel wall thickening,
inflammation or distension.

Vascular/Lymphatic: Normal appearance of the abdominal aorta. No
aneurysm. No abdominopelvic adenopathy identified.

Reproductive: Prostate is unremarkable.

Other: No free fluid or fluid collections.

Musculoskeletal: No acute or significant osseous findings. Lumbar
degenerative disc disease.

Review of the MIP images confirms the above findings.
IMPRESSION: 1. No evidence for acute pulmonary embolus.
2. Mild mosaic attenuation pattern identified bilaterally which may
reflect small airways disease.
3. Marked right hydronephrosis to the level of the UPJ with
significant atrophy of normal renal parenchyma. Findings are favored
to represent sequelae of chronic UPJ obstruction, which may be
congenital or acquired.
4. Hepatic steatosis.

## 2019-10-23 MED ORDER — IOHEXOL 350 MG/ML SOLN
100.0000 mL | Freq: Once | INTRAVENOUS | Status: AC | PRN
  Administered 2019-10-23: 80 mL via INTRAVENOUS

## 2019-10-23 MED ORDER — GUAIFENESIN ER 600 MG PO TB12
600.0000 mg | ORAL_TABLET | Freq: Two times a day (BID) | ORAL | Status: DC
  Administered 2019-10-24: 600 mg via ORAL
  Filled 2019-10-23 (×4): qty 1

## 2019-10-23 MED ORDER — DICLOFENAC SODIUM 1 % EX GEL
2.0000 g | Freq: Four times a day (QID) | CUTANEOUS | Status: DC
  Administered 2019-10-23 – 2019-10-25 (×8): 2 g via TOPICAL
  Filled 2019-10-23: qty 100

## 2019-10-23 MED ORDER — ACETAMINOPHEN 325 MG PO TABS
650.0000 mg | ORAL_TABLET | Freq: Four times a day (QID) | ORAL | Status: DC | PRN
  Administered 2019-10-23: 650 mg via ORAL
  Filled 2019-10-23: qty 2

## 2019-10-23 MED ORDER — LORAZEPAM 2 MG/ML IJ SOLN
1.0000 mg | Freq: Once | INTRAMUSCULAR | Status: AC
  Administered 2019-10-23: 1 mg via INTRAVENOUS
  Filled 2019-10-23: qty 1

## 2019-10-23 MED ORDER — ZOLPIDEM TARTRATE 5 MG PO TABS
5.0000 mg | ORAL_TABLET | Freq: Every evening | ORAL | Status: DC | PRN
  Administered 2019-10-23 – 2019-10-26 (×3): 5 mg via ORAL
  Filled 2019-10-23 (×3): qty 1

## 2019-10-23 MED ORDER — HYDROXYZINE HCL 25 MG PO TABS
25.0000 mg | ORAL_TABLET | Freq: Three times a day (TID) | ORAL | Status: DC | PRN
  Administered 2019-10-24 – 2019-10-26 (×4): 25 mg via ORAL
  Filled 2019-10-23 (×5): qty 1

## 2019-10-23 MED ORDER — LABETALOL HCL 5 MG/ML IV SOLN
10.0000 mg | Freq: Once | INTRAVENOUS | Status: AC
  Administered 2019-10-23: 10 mg via INTRAVENOUS
  Filled 2019-10-23: qty 4

## 2019-10-23 MED ORDER — SODIUM CHLORIDE 0.9 % IV BOLUS
1000.0000 mL | Freq: Once | INTRAVENOUS | Status: AC
  Administered 2019-10-23: 1000 mL via INTRAVENOUS

## 2019-10-23 MED ORDER — ENOXAPARIN SODIUM 40 MG/0.4ML ~~LOC~~ SOLN
40.0000 mg | SUBCUTANEOUS | Status: DC
  Administered 2019-10-23 – 2019-10-26 (×4): 40 mg via SUBCUTANEOUS
  Filled 2019-10-23 (×4): qty 0.4

## 2019-10-23 MED ORDER — SODIUM CHLORIDE 0.9 % IV SOLN
3.0000 g | Freq: Four times a day (QID) | INTRAVENOUS | Status: DC
  Administered 2019-10-23 – 2019-10-26 (×11): 3 g via INTRAVENOUS
  Filled 2019-10-23 (×4): qty 3
  Filled 2019-10-23: qty 8
  Filled 2019-10-23 (×3): qty 3
  Filled 2019-10-23: qty 8
  Filled 2019-10-23 (×2): qty 3
  Filled 2019-10-23: qty 8
  Filled 2019-10-23 (×2): qty 3

## 2019-10-23 MED ORDER — ONDANSETRON HCL 4 MG/2ML IJ SOLN: 4.0000 mg | Freq: Four times a day (QID) | INTRAMUSCULAR | Status: DC | PRN

## 2019-10-23 MED ORDER — METOPROLOL SUCCINATE ER 25 MG PO TB24: 50.0000 mg | ORAL_TABLET | Freq: Every day | ORAL | Status: DC

## 2019-10-23 MED ORDER — ONDANSETRON HCL 4 MG PO TABS: 4.0000 mg | ORAL_TABLET | Freq: Four times a day (QID) | ORAL | Status: DC | PRN

## 2019-10-23 MED ORDER — SODIUM CHLORIDE 0.9% FLUSH
3.0000 mL | Freq: Two times a day (BID) | INTRAVENOUS | Status: DC
  Administered 2019-10-23 – 2019-10-25 (×5): 3 mL via INTRAVENOUS

## 2019-10-23 MED ORDER — ACETAMINOPHEN 650 MG RE SUPP: 650.0000 mg | Freq: Four times a day (QID) | RECTAL | Status: DC | PRN

## 2019-10-23 MED ORDER — LORATADINE 10 MG PO TABS
10.0000 mg | ORAL_TABLET | Freq: Every day | ORAL | Status: DC
  Administered 2019-10-24 – 2019-10-26 (×3): 10 mg via ORAL
  Filled 2019-10-23 (×3): qty 1

## 2019-10-23 MED ORDER — HYDROMORPHONE HCL 1 MG/ML IJ SOLN
1.0000 mg | Freq: Once | INTRAMUSCULAR | Status: AC
  Administered 2019-10-23: 1 mg via INTRAVENOUS
  Filled 2019-10-23: qty 1

## 2019-10-23 MED ORDER — MAGNESIUM SULFATE 2 GM/50ML IV SOLN
2.0000 g | Freq: Once | INTRAVENOUS | Status: AC
  Administered 2019-10-23: 2 g via INTRAVENOUS
  Filled 2019-10-23: qty 50

## 2019-10-23 MED ORDER — FLUTICASONE PROPIONATE 50 MCG/ACT NA SUSP
1.0000 | Freq: Every day | NASAL | Status: DC
  Administered 2019-10-23 – 2019-10-25 (×3): 1 via NASAL
  Filled 2019-10-23: qty 16

## 2019-10-23 MED ORDER — METOPROLOL SUCCINATE ER 100 MG PO TB24
100.0000 mg | ORAL_TABLET | Freq: Every day | ORAL | Status: DC
  Administered 2019-10-23 – 2019-10-26 (×4): 100 mg via ORAL
  Filled 2019-10-23 (×4): qty 1

## 2019-10-23 NOTE — ED Triage Notes (Signed)
Pt BIB [facility], pt c/o near syncope, dizziness, and palpitations. Pt reports these symptoms have been intermittent x 2 months. Reports he has recently had green/yellow stools. Tonight pt had upper abdominal discomfort and worsening palpitataions. EMS HR 140, and hypertensive.

## 2019-10-23 NOTE — ED Notes (Signed)
Ordered breakfast 

## 2019-10-23 NOTE — Progress Notes (Signed)
Patient to room 4E08 from ED. Vital signs obtained. On monitor CCMD notified. CHG bath completed. Alert and oriented to room and call light. Call bell within reach. Dorothy Polhemus R Georgann Bramble, RN  

## 2019-10-23 NOTE — ED Notes (Signed)
Pt returned from CT °

## 2019-10-23 NOTE — H&P (Signed)
History and Physical    MAHER SHON KNL:[id number] DOB: Sep 30, 1960 DOA: 10/22/2019  Referring MD/NP/PA: Lyda Perone, MD PCP: Olive Bass, MD  Patient coming from: Via EMS  Chief Complaint: Heart rates going up and down   I have personally briefly reviewed patient's old medical records in [facility]   HPI: MCCRAE SPECIALE is a 59 y.o. male with medical history significant of essential hypertension and remote history of tobacco use presents with complaints of elevated heart rates and anxiousness over the last 2 months.  Symptoms started after he was hospitalized in June at [facility] for a pneumonia.   Symptoms  usually occur while he is sitting up or standing and reports that he has had more episodes in the evening around 5-6 PM.  When he tries to get up it feels like he is on a boat although the room is not spinning around him.  He reports difficulty walking, but also notes that he has pain in his left knee.  He has been evaluated by his primary care provider for the symptoms and he was switched from losartan to metoprolol 50 mg.  At one point they tried increasing to metoprolol 100 mg daily, but the patient did not notice any change in his symptoms.  Notes associated symptoms of intermittent abdominal distention with cramping, change in stool color, lower back pain, sinus congestion, sinus pain, and right lower jaw pain that he suspects may be related to a tooth infection.  He had recently been started on Augmentin to treat for the possibility of a sinus infection without any change in symptoms.  Denies having any significant fever, nausea, vomiting, diarrhea, or loss of conscious symptoms.     ED Course: Upon admission into the emergency department patient was seen to be afebrile, pulse 1 19-1 56, respirations 12-36, blood pressures 144/84 -178/120, and O2 saturations maintained on room air.  For WBC 16.4, hemoglobin 17.9, potassium 3.4, CO2 19, glucose 159,  anion gap 16, CRP 1.5, lactic acid 3, troponin negative x2, CRP 1.5, sed rate 8, TSH 1.305, and free T4 0.98.  Chest x-ray showed no acute abnormalities.  CT angiogram of the chest, abdomen, and pelvis did not show any signs of PE.  Imaging did note a mosaic pattern bilaterally concerning for small airway disease and marked right hydronephrosis secondary to what appeared to be chronic UPJ junction obstruction.  Patient had been given 2 L of normal saline IV fluids, Ativan, Dilaudid IV.  Review of Systems  Constitutional: Negative for fever and malaise/fatigue.  HENT: Positive for congestion and sinus pain.        Positive for Tooth pain  Eyes: Negative for double vision.  Respiratory: Negative for cough and shortness of breath.   Cardiovascular: Positive for palpitations. Negative for leg swelling.  Gastrointestinal: Positive for abdominal pain. Negative for nausea and vomiting.  Genitourinary: Negative for dysuria.  Musculoskeletal: Positive for back pain and joint pain.  Skin: Negative for itching.  Neurological: Positive for dizziness. Negative for focal weakness and loss of consciousness.  Psychiatric/Behavioral: Negative for substance abuse.    Past Medical History:  Diagnosis Date  . Back pain   . History of tobacco abuse   . Hypertension     History reviewed. No pertinent surgical history.   reports that he has quit smoking. His smoking use included cigarettes. He has never used smokeless tobacco. He reports current alcohol use. No history on file for drug use.  Allergies  Allergen  Reactions  . Sulfamethoxazole Hives  . Triamcinolone Acetonide Hives    Family History  Problem Relation Age of Onset  . Colon cancer Mother   . Hypertension Father   . Diabetes Sister    No current facility-administered medications on file prior to encounter.   Current Outpatient Medications on File Prior to Encounter  Medication Sig Dispense Refill  . acetaminophen (TYLENOL) 500 MG  tablet Take 500 mg by mouth every 6 (six) hours as needed for moderate pain.    Marland Kitchen amoxicillin-clavulanate (AUGMENTIN) 875-125 MG tablet Take 1 tablet by mouth 2 (two) times daily.    . cetirizine (ZYRTEC) 10 MG tablet Take 10 mg by mouth daily.    . fluticasone (FLONASE) 50 MCG/ACT nasal spray Place 1 spray into both nostrils in the morning and at bedtime.    Marland Kitchen losartan-hydrochlorothiazide (HYZAAR) 100-12.5 MG tablet Take 1 tablet by mouth daily.    . meclizine (ANTIVERT) 25 MG tablet Take 25 mg by mouth 3 (three) times daily as needed for dizziness.    . metoprolol succinate (TOPROL-XL) 50 MG 24 hr tablet Take 50 mg by mouth daily. Take with or immediately following a meal.    . zolpidem (AMBIEN CR) 12.5 MG CR tablet Take 12.5 mg by mouth daily.       Physical Exam:  Constitutional: Middle-age male who appears to be anxious but able to answer all questions Vitals:   10/23/19 0415 10/23/19 0430 10/23/19 0545 10/23/19 0615  BP: (!) 161/69 (!) 144/84 (!) 158/66 (!) 171/73  Pulse: (!) 140 (!) 137 (!) 140 (!) 143  Resp: Temp:      TempSrc:      SpO2: 96% 95% 94% 95%   Eyes: extrophia of left eye ENMT: Mucous membranes are dry.  Sinus congestion with posterior calling appreciated.  Swelling appreciated of the right lower molar.  Neck: normal, supple, no masses, no thyromegaly Respiratory: clear to auscultation bilaterally, no wheezing, no crackles. Normal respiratory effort. No accessory muscle use.  Cardiovascular: Tachycardic with intermittent PVCs.  No significant lower extremity edema. Abdomen: no tenderness, no masses palpated. No hepatosplenomegaly. Bowel sounds positive.  Musculoskeletal: no clubbing / cyanosis.  Moderate crepitation of the left knee appreciated. Skin: no rashes, lesions, ulcers. No induration Neurologic: CN 2-12 grossly intact. Sensation intact, DTR normal. Strength 5/5 in all 4.  Psychiatric: Normal judgment and insight. Alert and oriented x 3.  Anxious  mood.     Labs on Admission: I have personally reviewed following labs and imaging studies  CBC: Recent Labs  Lab 10/23/19 0006  WBC 16.4*  HGB 17.9*  HCT 50.5  MCV 85.7  PLT 350   Basic Metabolic Panel: Recent Labs  Lab 10/23/19 0006  NA 137  K 3.4*  CL 102  CO2 19*  GLUCOSE 156*  BUN 13  CREATININE 1.24  CALCIUM 10.0   GFR: CrCl cannot be calculated (Unknown ideal weight.). Liver Function Tests: Recent Labs  Lab 10/23/19 0006  AST 35  ALT 63*  ALKPHOS 71  BILITOT 1.0  PROT 7.7  ALBUMIN 4.4   Recent Labs  Lab 10/23/19 0006  LIPASE 38   No results for input(s): AMMONIA in the last 168 hours. Coagulation Profile: No results for input(s): INR, PROTIME in the last 168 hours. Cardiac Enzymes: No results for input(s): CKTOTAL, CKMB, CKMBINDEX, TROPONINI in the last 168 hours. BNP (last 3 results) No results for input(s): PROBNP in the last 8760 hours. HbA1C: No results  for input(s): HGBA1C in the last 72 hours. CBG: No results for input(s): GLUCAP in the last 168 hours. Lipid Profile: No results for input(s): CHOL, HDL, LDLCALC, TRIG, CHOLHDL, LDLDIRECT in the last 72 hours. Thyroid Function Tests: Recent Labs    10/23/19 0145  TSH 1.305  FREET4 0.98   Anemia Panel: No results for input(s): VITAMINB12, FOLATE, FERRITIN, TIBC, IRON, RETICCTPCT in the last 72 hours. Urine analysis:    Component Value Date/Time   COLORURINE YELLOW 10/23/2019 0153   APPEARANCEUR CLEAR 10/23/2019 0153   LABSPEC 1.009 10/23/2019 0153   PHURINE 7.0 10/23/2019 0153   GLUCOSEU NEGATIVE 10/23/2019 0153   HGBUR NEGATIVE 10/23/2019 0153   BILIRUBINUR NEGATIVE 10/23/2019 0153   KETONESUR NEGATIVE 10/23/2019 0153   PROTEINUR 100 (A) 10/23/2019 0153   NITRITE NEGATIVE 10/23/2019 0153   LEUKOCYTESUR NEGATIVE 10/23/2019 0153   Sepsis Labs: Recent Results (from the past 240 hour(s))  SARS Coronavirus 2 by RT PCR (hospital order, performed in [facility] lab)  Nasopharyngeal Nasopharyngeal Swab     Status: None   Collection Time: 10/23/19  1:45 AM   Specimen: Nasopharyngeal Swab  Result Value Ref Range Status   SARS Coronavirus 2 NEGATIVE NEGATIVE Final    Comment: (NOTE) SARS-CoV-2 target nucleic acids are NOT DETECTED.  The SARS-CoV-2 RNA is generally detectable in upper and lower respiratory specimens during the acute phase of infection. The lowest concentration of SARS-CoV-2 viral copies this assay can detect is 250 copies / mL. A negative result does not preclude SARS-CoV-2 infection and should not be used as the sole basis for treatment or other patient management decisions.  A negative result may occur with improper specimen collection / handling, submission of specimen other than nasopharyngeal swab, presence of viral mutation(s) within the areas targeted by this assay, and inadequate number of viral copies (<250 copies / mL). A negative result must be combined with clinical observations, patient history, and epidemiological information.  Fact Sheet for Patients:   BoilerBrush.com.cyhttps://www.fda.gov/media/136312/download  Fact Sheet for Healthcare Providers: https://pope.com/https://www.fda.gov/media/136313/download  This test is not yet approved or  cleared by the [place] FDA and has been authorized for detection and/or diagnosis of SARS-CoV-2 by FDA under an Emergency Use Authorization (EUA).  This EUA will remain in effect (meaning this test can be used) for the duration of the COVID-19 declaration under Section 564(b)(1) of the Act, 21 U.S.C. section 360bbb-3(b)(1), unless the authorization is terminated or revoked sooner.  Performed at [facility], [address] [place], [place] [address]      Radiological Exams on Admission: CT ANGIO CHEST PE W OR WO CONTRAST  Result Date: 10/23/2019 CLINICAL DATA:  Abdominal pain, acute. Near syncope, dizziness and palpitations. EXAM: CT ANGIOGRAPHY CHEST CT ABDOMEN AND PELVIS WITH CONTRAST  TECHNIQUE: Multidetector CT imaging of the chest was performed using the standard protocol during bolus administration of intravenous contrast. Multiplanar CT image reconstructions and MIPs were obtained to evaluate the vascular anatomy. Multidetector CT imaging of the abdomen and pelvis was performed using the standard protocol during bolus administration of intravenous contrast. CONTRAST:  80mL OMNIPAQUE IOHEXOL 350 MG/ML SOLN COMPARISON:  CT angio chest 08/09/2019 FINDINGS: CTA CHEST FINDINGS Cardiovascular: The main pulmonary artery appears patent. No saddle embolus. No lobar or segmental pulmonary artery filling defects identified. Normal heart size. Mediastinum/Nodes: No enlarged mediastinal, hilar, or axillary lymph nodes. Thyroid gland, trachea, and esophagus demonstrate no significant findings. Lungs/Pleura: No pleural effusion identified. Mild mosaic attenuation pattern is identified bilaterally. No  airspace consolidation identified. Musculoskeletal: No acute or suspicious osseous findings. Review of the MIP images confirms the above findings. CT ABDOMEN and PELVIS FINDINGS Hepatobiliary: Hepatic steatosis. No focal liver abnormality identified. Gallbladder is unremarkable. No bile duct dilatation. Pancreas: Unremarkable. No pancreatic ductal dilatation or surrounding inflammatory changes. Spleen: Normal in size without focal abnormality. Adrenals/Urinary Tract: Normal appearance of the adrenal glands. Prominent left kidney extrarenal pelvis noted. No left kidney hydronephrosis or mass identified. Mild left hydroureter is identified. No left ureteral lithiasis or mass identified. There are signs of marked right hydronephrosis to the level of the UPJ with significant atrophy of normal renal parenchyma. Normal caliber right ureter. Urinary bladder appears unremarkable. Stomach/Bowel: The stomach appears nondistended. The appendix is visualized and appears normal. There is no bowel wall thickening,  inflammation or distension. Vascular/Lymphatic: Normal appearance of the abdominal aorta. No aneurysm. No abdominopelvic adenopathy identified. Reproductive: Prostate is unremarkable. Other: No free fluid or fluid collections. Musculoskeletal: No acute or significant osseous findings. Lumbar degenerative disc disease. Review of the MIP images confirms the above findings. IMPRESSION: 1. No evidence for acute pulmonary embolus. 2. Mild mosaic attenuation pattern identified bilaterally which may reflect small airways disease. 3. Marked right hydronephrosis to the level of the UPJ with significant atrophy of normal renal parenchyma. 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 4. Hepatic steatosis. Electronically Signed   By: Signa Kell M.D.   On: 10/23/2019 05:39   CT ABDOMEN PELVIS W CONTRAST  Result Date: 10/23/2019 CLINICAL DATA:  Abdominal pain, acute. Near syncope, dizziness and palpitations. EXAM: CT ANGIOGRAPHY CHEST CT ABDOMEN AND PELVIS WITH CONTRAST TECHNIQUE: Multidetector CT imaging of the chest was performed using the standard protocol during bolus administration of intravenous contrast. Multiplanar CT image reconstructions and MIPs were obtained to evaluate the vascular anatomy. Multidetector CT imaging of the abdomen and pelvis was performed using the standard protocol during bolus administration of intravenous contrast. CONTRAST:  103mL OMNIPAQUE IOHEXOL 350 MG/ML SOLN COMPARISON:  CT angio chest 08/09/2019 FINDINGS: CTA CHEST FINDINGS Cardiovascular: The main pulmonary artery appears patent. No saddle embolus. No lobar or segmental pulmonary artery filling defects identified. Normal heart size. Mediastinum/Nodes: No enlarged mediastinal, hilar, or axillary lymph nodes. Thyroid gland, trachea, and esophagus demonstrate no significant findings. Lungs/Pleura: No pleural effusion identified. Mild mosaic attenuation pattern is identified  bilaterally. No airspace consolidation identified. Musculoskeletal: No acute or suspicious osseous findings. Review of the MIP images confirms the above findings. CT ABDOMEN and PELVIS FINDINGS Hepatobiliary: Hepatic steatosis. No focal liver abnormality identified. Gallbladder is unremarkable. No bile duct dilatation. Pancreas: Unremarkable. No pancreatic ductal dilatation or surrounding inflammatory changes. Spleen: Normal in size without focal abnormality. Adrenals/Urinary Tract: Normal appearance of the adrenal glands. Prominent left kidney extrarenal pelvis noted. No left kidney hydronephrosis or mass identified. Mild left hydroureter is identified. No left ureteral lithiasis or mass identified. There are signs of marked right hydronephrosis to the level of the UPJ with significant atrophy of normal renal parenchyma. Normal caliber right ureter. Urinary bladder appears unremarkable. Stomach/Bowel: The stomach appears nondistended. The appendix is visualized and appears normal. There is no bowel wall thickening, inflammation or distension. Vascular/Lymphatic: Normal appearance of the abdominal aorta. No aneurysm. No abdominopelvic adenopathy identified. Reproductive: Prostate is unremarkable. Other: No free fluid or fluid collections. Musculoskeletal: No acute or significant osseous findings. Lumbar degenerative disc disease. Review of the MIP images confirms the above findings. IMPRESSION: 1. No evidence for acute pulmonary embolus.  2. Mild mosaic attenuation pattern identified bilaterally which may reflect small airways disease. 3. Marked right hydronephrosis to the level of the UPJ with significant atrophy of normal renal parenchyma. 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 4. Hepatic steatosis. Electronically Signed   By: Signa Kell M.D.   On: 10/23/2019 05:39   DG Chest Port 1 View  Result Date: 10/23/2019 CLINICAL DATA:  Dizziness,  palpitations, near syncope EXAM: PORTABLE CHEST 1 VIEW COMPARISON:  08/09/2019 FINDINGS: Two frontal views of the chest demonstrate an unremarkable cardiac silhouette. No airspace disease, effusion, or pneumothorax. No acute bony abnormalities. IMPRESSION: 1. No acute intrathoracic process. Electronically Signed   By: Sharlet Salina M.D.   On: 10/23/2019 02:45    EKG: Independently reviewed.  Sinus tachycardia 142 bpm  Assessment/Plan SIRS  lactic acidosis elevated CRP: Acute.  Presented with tachycardia, tachypnea, and WBC elevated up to 16.4 meeting SIRS criteria.  Lactic acid initially 3 and CRP elevated at 1.5.  Urinalysis and chest x-ray did not show any clear signs of infection.  Patient had recently been placed on treatment for sinusitis with Augmentin without improvement.  He had been given 2 L normal saline IV fluids.  Unclear patient symptoms could be coming from possible dental infection versus sinusitis. -Admit to a progressive bed  -Check blood cultures -Hold diuretics  -Trend lactic acid levels -Check Panorex -Unasyn IV -Mucinex  Sinus tachycardia essential hypertension: Acute.  Patient presented with heart rates elevated up to 156 orders elevated at 178/120.  Thyroid studies within normal limits with TSH 1.305 and free T4 0.98.  No abnormalities on imaging to suggest concern for pheochromocytoma.  Home medications included metoprolol 50 mg daily and losartan-hydrochlorothiazide.  Discussed case with Dr. Anne Fu of cardiology over the phone who was in agreement with increasing metoprolol succinate 200 mg daily -Check UDS -Increase metoprolol succinate from 50 mg daily to 100 mg daily -Held losartan-hydrochlorothiazide due to diuretic due to patient's lactic acidosis -Check echocardiogram -Consider formally consulting cardiology if needed  Dizziness: Acute.  Patient reported having episodes of lightheadedness/dizziness.  Suspect related to patient's tachycardia. -Check  orthostatic vital signs -PT to eval and treat  Hypokalemia: Acute.  Potassium just mildly low at 3.4.  Patient on Hyzaar 100-12.5 mg which could be contributing to mildly low potassium levels. -Give 20 mEq of potassium chloride x1 dose now -Continue to monitor and replace as needed  Metabolic acidosis with elevated anion gap: Acute.  CO2 noted to be 19 with anion gap 16.  Suspect this could be secondary to the lactic acidosis as seen above. -Continue to monitor  Anxiety: Patient reports being intermittently anxious during these episodes. -Hydroxyzine p.o. as needed for anxiety  Elevated ALT: Acute.  ALT elevated at 63.  Unclear cause of symptoms. -Continue to monitor  Osteoarthritis of the left knee: Patient noted to have moderate crepitation on physical exam.  No significant joint effusion appreciated. 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   DVT prophylaxis: Lovenox Code Status: Full Family Communication: Wife updated at bedside Disposition Plan: Possible discharge in 1 to 2 days Consults called: Case discussed with Dr. Anne Fu over the phone Admission status: Observation  Clydie Braun MD [facility] Pager [phone]   If 7PM-7AM, please contact night-coverage [email or web address] Password [facility]  10/23/2019, 8:37 AM

## 2019-10-23 NOTE — Progress Notes (Signed)
  Echocardiogram 2D Echocardiogram has been performed.  David Gill 10/23/2019, 1:46 PM

## 2019-10-23 NOTE — ED Provider Notes (Signed)
[facility] EMERGENCY DEPARTMENT Provider Note   CSN: [id number] Arrival date & time: 10/22/19  2353     History Chief Complaint  Patient presents with  . Tachycardia    David Gill is a 59 y.o. male.  Patient presents to the emergency department for evaluation of anxiousness with rapid heart rate.  Symptoms have been occurring intermittently for approximately 2 months.  He has been evaluated by his doctor for this in the past.  Patient reports that he became very dizzy tonight, felt like he was going to pass out so he called EMS.  The symptoms are what he has been experiencing long but recently he has had some abdominal distention and he has noticed a change in the color of his stools but is not experiencing nausea, vomiting or diarrhea.  Patient reports that he has noticed when he takes his Ambien at night symptoms improved.  When he wakes up he feels well but usually in the early evening he starts to have recurrence of the symptoms.  He reports that the rapid heart rate generally is in conjunction with severe feelings of anxiousness and feeling very jittery.  He does not have chest pain or shortness of breath with the symptoms.        No past medical history on file.  There are no problems to display for this patient.       No family history on file.  Social History   Tobacco Use  . Smoking status: Not on file  Substance Use Topics  . Alcohol use: Not on file  . Drug use: Not on file    Home Medications Prior to Admission medications   Not on File    Allergies    Sulfamethoxazole and Triamcinolone acetonide  Review of Systems   Review of Systems  Gastrointestinal: Positive for abdominal distention and abdominal pain.  Neurological: Positive for dizziness.  Psychiatric/Behavioral: The patient is nervous/anxious.   All other systems reviewed and are negative.   Physical Exam Updated Vital Signs BP (!) 171/73   Pulse (!) 143    Temp 98.6 F (37 C) (Oral)   Resp 16   SpO2 95%   Physical Exam Vitals and nursing note reviewed.  Constitutional:      General: He is not in acute distress.    Appearance: Normal appearance. He is well-developed.  HENT:     Head: Normocephalic and atraumatic.     Right Ear: Hearing normal.     Left Ear: Hearing normal.     Nose: Nose normal.  Eyes:     Conjunctiva/sclera: Conjunctivae normal.     Pupils: Pupils are equal, round, and reactive to light.  Cardiovascular:     Rate and Rhythm: Regular rhythm. Tachycardia present.     Heart sounds: S1 normal and S2 normal. No murmur heard.  No friction rub. No gallop.   Pulmonary:     Effort: Pulmonary effort is normal. No respiratory distress.     Breath sounds: Normal breath sounds.  Chest:     Chest wall: No tenderness.  Abdominal:     General: Bowel sounds are normal.     Palpations: Abdomen is soft.     Tenderness: There is no abdominal tenderness. There is no guarding or rebound. Negative signs include Murphy's sign and McBurney's sign.     Hernia: No hernia is present.  Musculoskeletal:        General: Normal range of motion.     Cervical  back: Normal range of motion and neck supple.  Skin:    General: Skin is warm and dry.     Findings: No rash.  Neurological:     Mental Status: He is alert and oriented to person, place, and time.     GCS: GCS eye subscore is 4. GCS verbal subscore is 5. GCS motor subscore is 6.     Cranial Nerves: No cranial nerve deficit.     Sensory: No sensory deficit.     Coordination: Coordination normal.  Psychiatric:        Mood and Affect: Mood is anxious.        Speech: Speech normal.        Behavior: Behavior normal.        Thought Content: Thought content normal.     ED Results / Procedures / Treatments   Labs (all labs ordered are listed, but only abnormal results are displayed) Labs Reviewed  COMPREHENSIVE METABOLIC PANEL - Abnormal; Notable for the following components:       Result Value   Potassium 3.4 (*)    CO2 19 (*)    Glucose, Bld 156 (*)    ALT 63 (*)    Anion gap 16 (*)    All other components within normal limits  CBC - Abnormal; Notable for the following components:   WBC 16.4 (*)    RBC 5.89 (*)    Hemoglobin 17.9 (*)    All other components within normal limits  URINALYSIS, ROUTINE W REFLEX MICROSCOPIC - Abnormal; Notable for the following components:   Protein, ur 100 (*)    All other components within normal limits  C-REACTIVE PROTEIN - Abnormal; Notable for the following components:   CRP 1.5 (*)    All other components within normal limits  LACTIC ACID, PLASMA - Abnormal; Notable for the following components:   Lactic Acid, Venous 3.0 (*)    All other components within normal limits  SARS CORONAVIRUS 2 BY RT PCR (HOSPITAL ORDER, PERFORMED IN [facility])  LIPASE, BLOOD  TSH  T4, FREE  SEDIMENTATION RATE  D-DIMER, QUANTITATIVE (NOT AT [facility])  T3, FREE  SALICYLATE LEVEL  TROPONIN I (HIGH SENSITIVITY)  TROPONIN I (HIGH SENSITIVITY)    EKG EKG Interpretation  Date/Time:  Friday October 22 2019 23:54:30 EDT Ventricular Rate:  142 PR Interval:  124 QRS Duration: 82 QT Interval:  314 QTC Calculation: 483 R Axis:   70 Text Interpretation: Sinus tachycardia Otherwise normal ECG No previous tracing Confirmed by Gilda CreasePollina, Horris Speros J [phone]([id number]) on 10/23/2019 1:25:29 AM   Radiology CT ANGIO CHEST PE W OR WO CONTRAST  Result Date: 10/23/2019 CLINICAL DATA:  Abdominal pain, acute. Near syncope, dizziness and palpitations. EXAM: CT ANGIOGRAPHY CHEST CT ABDOMEN AND PELVIS WITH CONTRAST TECHNIQUE: Multidetector CT imaging of the chest was performed using the standard protocol during bolus administration of intravenous contrast. Multiplanar CT image reconstructions and MIPs were obtained to evaluate the vascular anatomy. Multidetector CT imaging of the abdomen and pelvis was performed using the standard protocol during bolus  administration of intravenous contrast. CONTRAST:  80mL OMNIPAQUE IOHEXOL 350 MG/ML SOLN COMPARISON:  CT angio chest 08/09/2019 FINDINGS: CTA CHEST FINDINGS Cardiovascular: The main pulmonary artery appears patent. No saddle embolus. No lobar or segmental pulmonary artery filling defects identified. Normal heart size. Mediastinum/Nodes: No enlarged mediastinal, hilar, or axillary lymph nodes. Thyroid gland, trachea, and esophagus demonstrate no significant findings. Lungs/Pleura: No pleural effusion identified. Mild mosaic attenuation pattern is identified bilaterally.  No airspace consolidation identified. Musculoskeletal: No acute or suspicious osseous findings. Review of the MIP images confirms the above findings. CT ABDOMEN and PELVIS FINDINGS Hepatobiliary: Hepatic steatosis. No focal liver abnormality identified. Gallbladder is unremarkable. No bile duct dilatation. Pancreas: Unremarkable. No pancreatic ductal dilatation or surrounding inflammatory changes. Spleen: Normal in size without focal abnormality. Adrenals/Urinary Tract: Normal appearance of the adrenal glands. Prominent left kidney extrarenal pelvis noted. No left kidney hydronephrosis or mass identified. Mild left hydroureter is identified. No left ureteral lithiasis or mass identified. There are signs of marked right hydronephrosis to the level of the UPJ with significant atrophy of normal renal parenchyma. Normal caliber right ureter. Urinary bladder appears unremarkable. Stomach/Bowel: The stomach appears nondistended. The appendix is visualized and appears normal. There is no bowel wall thickening, inflammation or distension. Vascular/Lymphatic: Normal appearance of the abdominal aorta. No aneurysm. No abdominopelvic adenopathy identified. Reproductive: Prostate is unremarkable. Other: No free fluid or fluid collections. Musculoskeletal: No acute or significant osseous findings. Lumbar degenerative disc disease. Review of the MIP images confirms  the above findings. IMPRESSION: 1. No evidence for acute pulmonary embolus. 2. Mild mosaic attenuation pattern identified bilaterally which may reflect small airways disease. 3. Marked right hydronephrosis to the level of the UPJ with significant atrophy of normal renal parenchyma. 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 4. Hepatic steatosis. Electronically Signed   By: Signa Kell M.D.   On: 10/23/2019 05:39   CT ABDOMEN PELVIS W CONTRAST  Result Date: 10/23/2019 CLINICAL DATA:  Abdominal pain, acute. Near syncope, dizziness and palpitations. EXAM: CT ANGIOGRAPHY CHEST CT ABDOMEN AND PELVIS WITH CONTRAST TECHNIQUE: Multidetector CT imaging of the chest was performed using the standard protocol during bolus administration of intravenous contrast. Multiplanar CT image reconstructions and MIPs were obtained to evaluate the vascular anatomy. Multidetector CT imaging of the abdomen and pelvis was performed using the standard protocol during bolus administration of intravenous contrast. CONTRAST:  39mL OMNIPAQUE IOHEXOL 350 MG/ML SOLN COMPARISON:  CT angio chest 08/09/2019 FINDINGS: CTA CHEST FINDINGS Cardiovascular: The main pulmonary artery appears patent. No saddle embolus. No lobar or segmental pulmonary artery filling defects identified. Normal heart size. Mediastinum/Nodes: No enlarged mediastinal, hilar, or axillary lymph nodes. Thyroid gland, trachea, and esophagus demonstrate no significant findings. Lungs/Pleura: No pleural effusion identified. Mild mosaic attenuation pattern is identified bilaterally. No airspace consolidation identified. Musculoskeletal: No acute or suspicious osseous findings. Review of the MIP images confirms the above findings. CT ABDOMEN and PELVIS FINDINGS Hepatobiliary: Hepatic steatosis. No focal liver abnormality identified. Gallbladder is unremarkable. No bile duct dilatation. Pancreas: Unremarkable. No  pancreatic ductal dilatation or surrounding inflammatory changes. Spleen: Normal in size without focal abnormality. Adrenals/Urinary Tract: Normal appearance of the adrenal glands. Prominent left kidney extrarenal pelvis noted. No left kidney hydronephrosis or mass identified. Mild left hydroureter is identified. No left ureteral lithiasis or mass identified. There are signs of marked right hydronephrosis to the level of the UPJ with significant atrophy of normal renal parenchyma. Normal caliber right ureter. Urinary bladder appears unremarkable. Stomach/Bowel: The stomach appears nondistended. The appendix is visualized and appears normal. There is no bowel wall thickening, inflammation or distension. Vascular/Lymphatic: Normal appearance of the abdominal aorta. No aneurysm. No abdominopelvic adenopathy identified. Reproductive: Prostate is unremarkable. Other: No free fluid or fluid collections. Musculoskeletal: No acute or significant osseous findings. Lumbar degenerative disc disease. Review of the MIP images confirms the above findings. IMPRESSION: 1. No evidence for acute pulmonary  embolus. 2. Mild mosaic attenuation pattern identified bilaterally which may reflect small airways disease. 3. Marked right hydronephrosis to the level of the UPJ with significant atrophy of normal renal parenchyma. 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 4. Hepatic steatosis. Electronically Signed   By: Signa Kell M.D.   On: 10/23/2019 05:39   DG Chest Port 1 View  Result Date: 10/23/2019 CLINICAL DATA:  Dizziness, palpitations, near syncope EXAM: PORTABLE CHEST 1 VIEW COMPARISON:  08/09/2019 FINDINGS: Two frontal views of the chest demonstrate an unremarkable cardiac silhouette. No airspace disease, effusion, or pneumothorax. No acute bony abnormalities. IMPRESSION: 1. No acute intrathoracic process. Electronically Signed   By: Sharlet Salina M.D.   On:  10/23/2019 02:45    Procedures Procedures (including critical care time)  Medications Ordered in ED Medications  sodium chloride 0.9 % bolus 1,000 mL (0 mLs Intravenous Stopped 10/23/19 0353)  LORazepam (ATIVAN) injection 1 mg (1 mg Intravenous Given 10/23/19 0201)  sodium chloride 0.9 % bolus 1,000 mL (0 mLs Intravenous Stopped 10/23/19 0553)  HYDROmorphone (DILAUDID) injection 1 mg (1 mg Intravenous Given 10/23/19 0439)  iohexol (OMNIPAQUE) 350 MG/ML injection 100 mL (80 mLs Intravenous Contrast Given 10/23/19 0509)    ED Course  I have reviewed the triage vital signs and the nursing notes.  Pertinent labs & imaging results that were available during my care of the patient were reviewed by me and considered in my medical decision making (see chart for details).    MDM Rules/Calculators/A&P                          Patient presents to the emergency department with complaints of dizziness, near syncope with feelings of anxiety. Patient has accompanying tachycardia and palpitations. Patient has been experiencing intermittent episodes for a couple of months. He has seen his doctor and had some work-up that has not revealed the cause. He was placed on Lopressor for symptomatic relief by his doctor but it does not seem to have helped. He does think that when he takes his Ambien at night it makes him more, and he has not had episodes at night.  Patient very tachycardic here in the emergency department. He has been clearly in sinus tachycardia on the monitor and EKG. His HR has ranged from 119-150s. Basic chemistries reveals an anion gap acidosis of unclear etiology, although he does have a lactic acidosis which might explain the acidosis. Lactic was 3 and that was after he got approximately 1-1/2 L of fluid so possibly was higher. Etiology of the lactic acidosis is unclear at this time. CT abdomen and pelvis did not show any acute abnormality, no findings that would suggest ischemic bowel or other  infectious etiology that would explain his presentation. 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 Additionally, he had a CT angiography of chest to rule out PE, no acute findings were present.  Work-up has been unrevealing other than the lactic acidosis. He has been given 2 L of fluid and Ativan but has not had any improvement of his tachycardia. Patient reports that he does not drink alcohol, does not use any drugs that he could be withdrawing from. As his tachycardia has not improved, will admit to hospital for further management.  Final Clinical Impression(s) / ED Diagnoses Final diagnoses:  Lactic acidosis  Tachycardia    Rx / DC Orders ED  Discharge Orders    None       Gilda Crease, MD 10/23/19 [phone]

## 2019-10-24 ENCOUNTER — Observation Stay (HOSPITAL_COMMUNITY): Payer: Self-pay

## 2019-10-24 DIAGNOSIS — K760 Fatty (change of) liver, not elsewhere classified: Secondary | ICD-10-CM | POA: Insufficient documentation

## 2019-10-24 DIAGNOSIS — N261 Atrophy of kidney (terminal): Secondary | ICD-10-CM | POA: Insufficient documentation

## 2019-10-24 HISTORY — DX: Atrophy of kidney (terminal): N26.1

## 2019-10-24 HISTORY — DX: Fatty (change of) liver, not elsewhere classified: K76.0

## 2019-10-24 LAB — BASIC METABOLIC PANEL
Anion gap: 10 (ref 5–15)
BUN: 13 mg/dL (ref 6–20)
CO2: 22 mmol/L (ref 22–32)
Calcium: 8.9 mg/dL (ref 8.9–10.3)
Chloride: 106 mmol/L (ref 98–111)
Creatinine, Ser: 1.13 mg/dL (ref 0.61–1.24)
GFR calc Af Amer: >60 mL/min (ref >60)
GFR calc non Af Amer: >60 mL/min (ref >60)
Glucose, Bld: 98 mg/dL (ref 70–99)
Potassium: 3.7 mmol/L (ref 3.5–5.1)
Sodium: 138 mmol/L (ref 135–145)

## 2019-10-24 LAB — CBC
HCT: 41.1 % (ref 39.0–52.0)
Hemoglobin: 14.4 g/dL (ref 13.0–17.0)
MCH: 31.5 pg (ref 26.0–34.0)
MCHC: 35 g/dL (ref 30.0–36.0)
MCV: 89.9 fL (ref 80.0–100.0)
Platelets: 280 10*3/uL (ref 150–400)
RBC: 4.57 MIL/uL (ref 4.22–5.81)
RDW: 12.1 % (ref 11.5–15.5)
WBC: 11.6 10*3/uL — ABNORMAL HIGH (ref 4.0–10.5)
nRBC: 0 % (ref 0.0–0.2)

## 2019-10-24 IMAGING — CT CT MAXILLOFACIAL W/O CM
3 of 8 series · 14 of 47 positions shown, 17 images · non-contrast
Comparison: Report of [facility] [REDACTED] brain MRI [DATE] (no images available).

CLINICAL DATA: 59-year-old male is off balance, tachycardia, sinus
pain, right lower jaw pain recently started on Augmentin for
possible tooth infection.

EXAM:
CT MAXILLOFACIAL WITHOUT CONTRAST
TECHNIQUE: Multidetector CT imaging of the maxillofacial structures was
performed. Multiplanar CT image reconstructions were also generated.

[Series 4: st thins · axial · 0.40mm/px · z∈[-214,-68]mm · 9 of 263 slices shown, 12 images]
[im 27/263  brain]
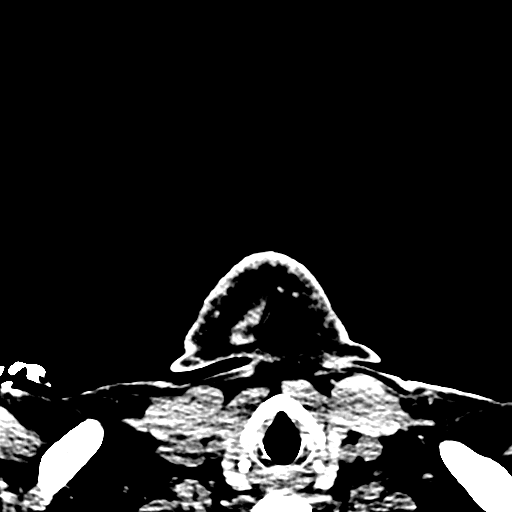
[im 27/263  bone]
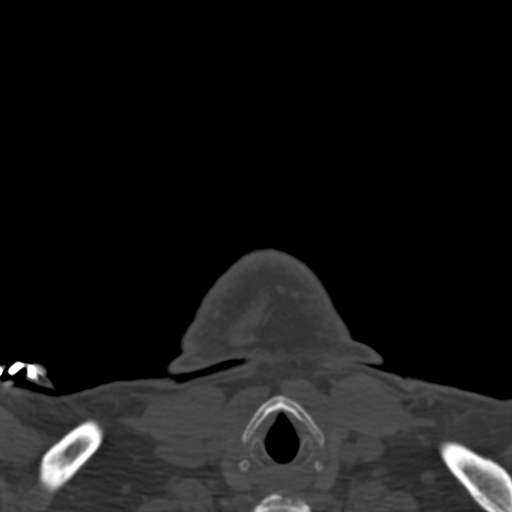
[im 53/263  bone]
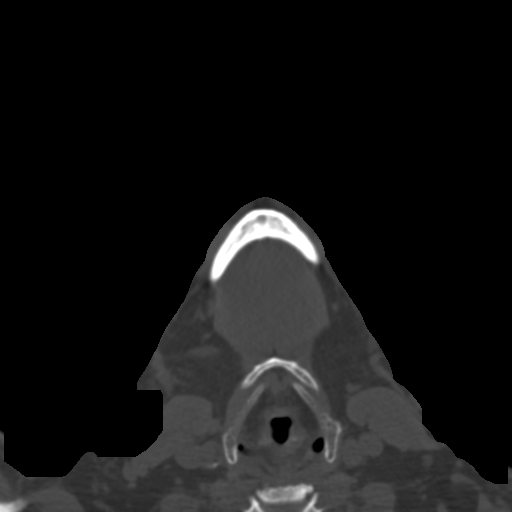
[im 79/263  bone]
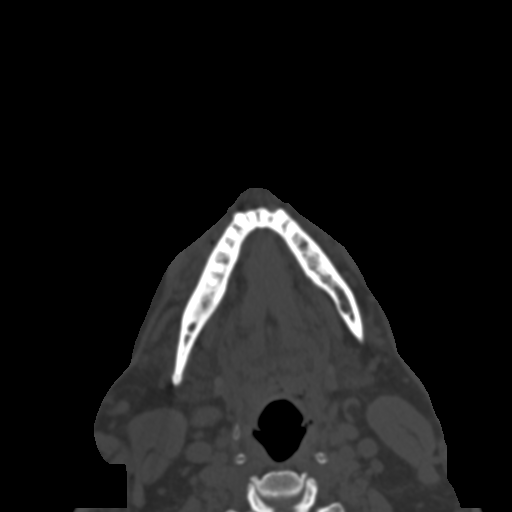
[im 105/263  bone]
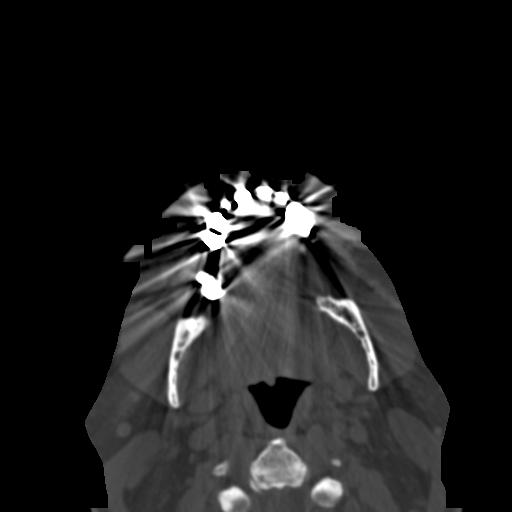
[im 132/263  brain]
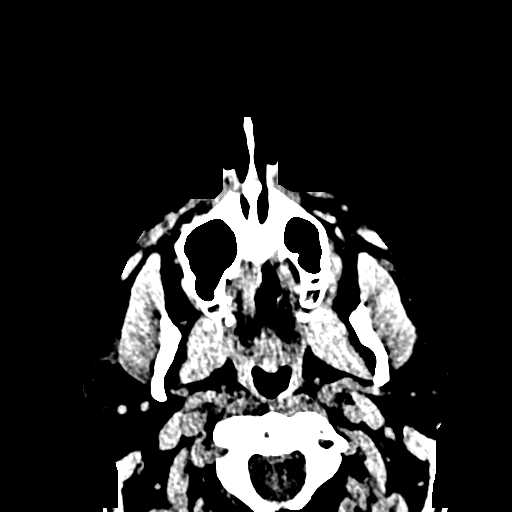
[im 132/263  bone]
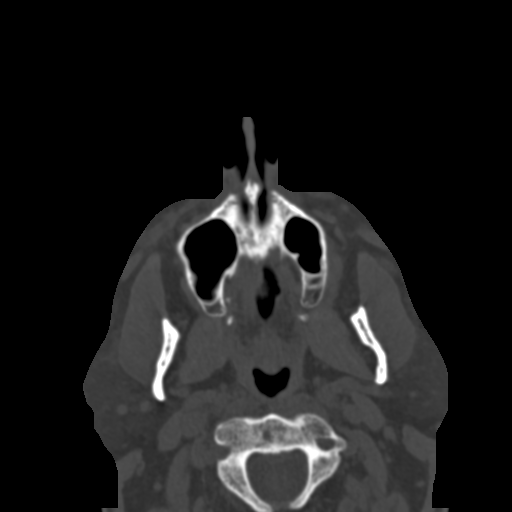
[im 158/263  bone]
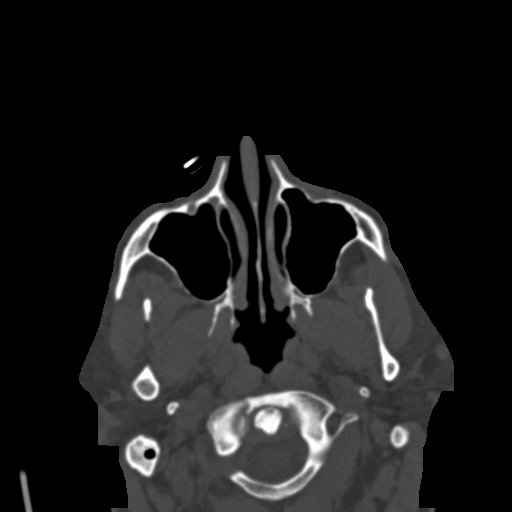
[im 184/263  bone]
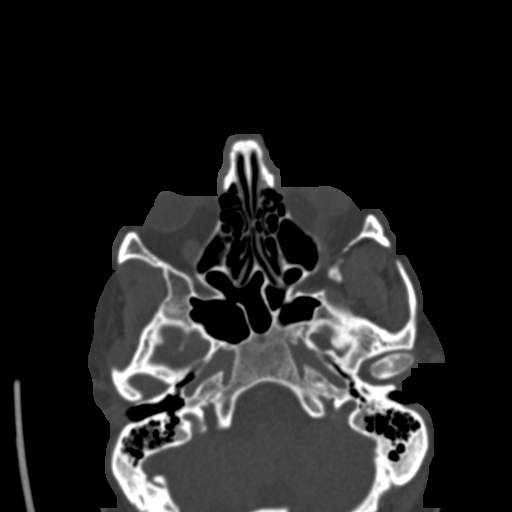
[im 210/263  bone]
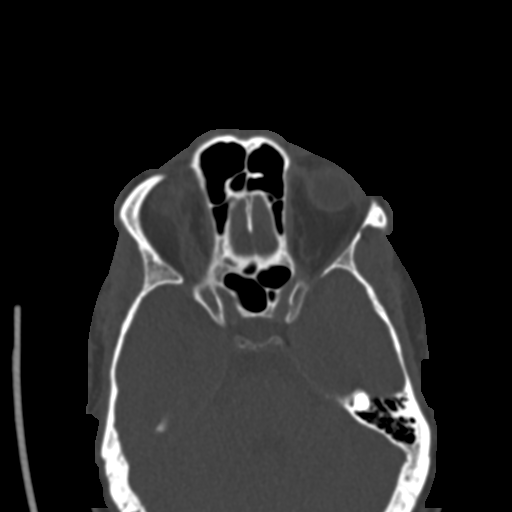
[im 236/263  brain]
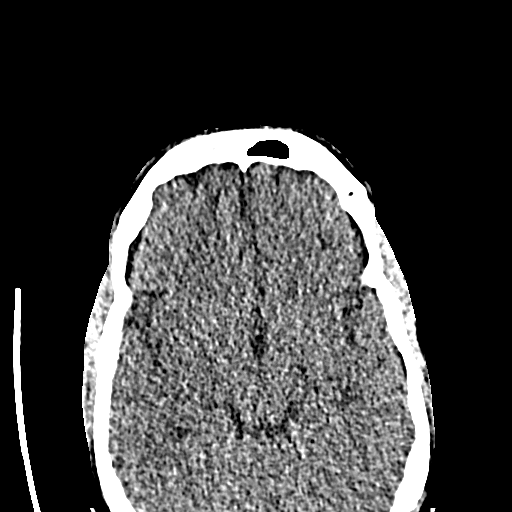
[im 236/263  bone]
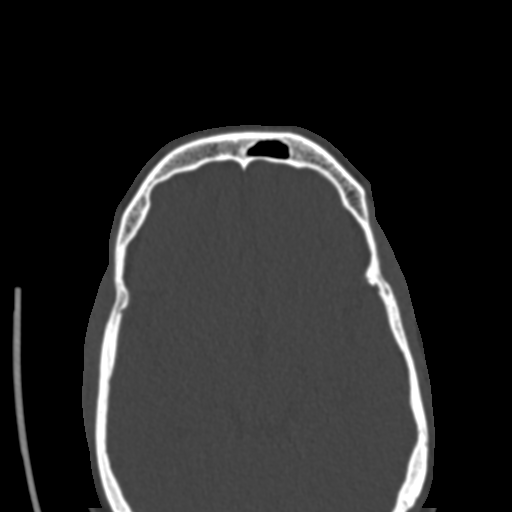

[Series 7: st cor · coronal · 0.36mm/px · 3 of 89 slices shown]
[im 23/89  bone]
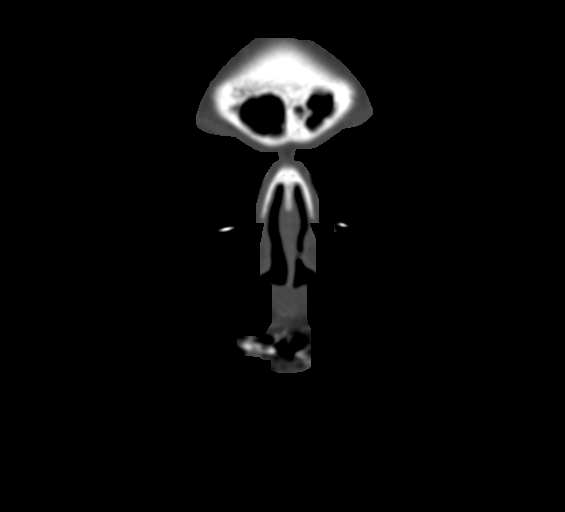
[im 45/89  bone]
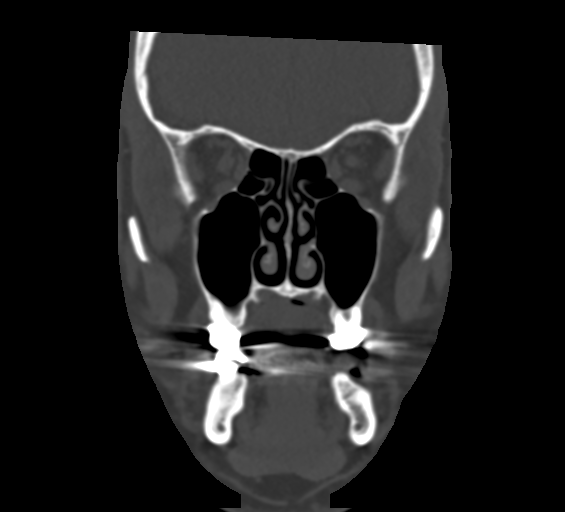
[im 67/89  bone]
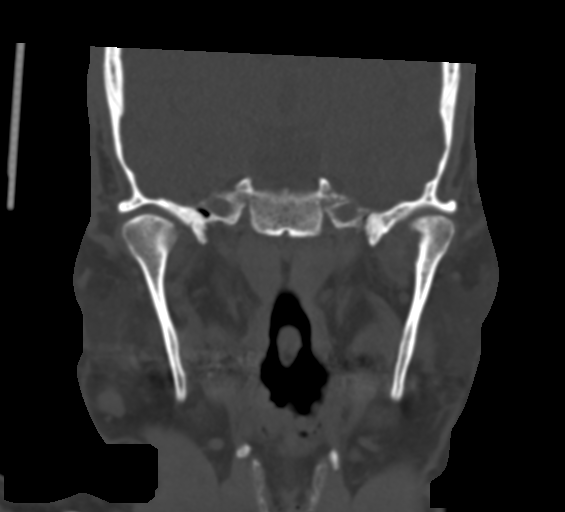

[Series 10: bone sag · sagittal · 0.38mm/px · 2 of 107 slices shown]
[im 36/107  bone]
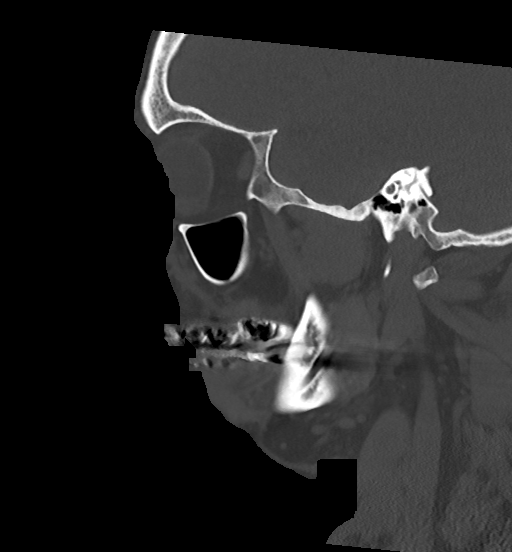
[im 71/107  bone]
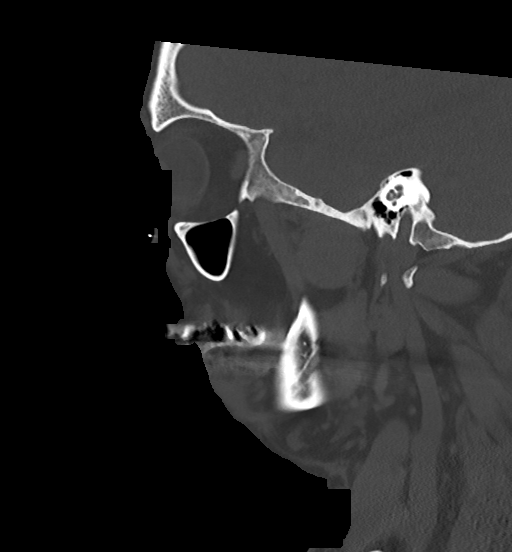

[14 of 47 positions shown; findings below may reference images not displayed]

FINDINGS: Osseous: Carious residual left mandible bicuspid and molar. Carious
contralateral right maxillary anterior molar, and the posterior
right mandible molar demonstrates abnormal periapical lucency
(series 5, image 33). But no definite overlying soft tissue
inflammation is identified.

Mandible is normally located. No facial fracture identified. Visible
calvarium, central skull base, and cervical vertebrae appear intact.

Orbits: Intact orbital walls. Symmetric and negative noncontrast
orbits soft tissues aside from Disconjugate gaze.

Sinuses: Paranasal sinuses are clear throughout. Unremarkable nasal
cavity. Tympanic cavities and mastoids are clear.

Soft tissues: Negative visible noncontrast thyroid, larynx, pharynx,
parapharyngeal spaces, retropharyngeal space, sublingual space,
submandibular spaces, masticator spaces, and parotid spaces.

No upper cervical lymphadenopathy.

Limited intracranial: Negative visible noncontrast brain parenchyma.
IMPRESSION: 1. Carious posterior dentition, including posterior right mandible
molar with abnormal periapical lucency, but no associated soft
tissue inflammation identified.

2. Otherwise negative noncontrast Face CT. Sinuses and middle ears
are clear.

## 2019-10-24 MED ORDER — HYDRALAZINE HCL 20 MG/ML IJ SOLN: 5.0000 mg | INTRAMUSCULAR | Status: DC | PRN

## 2019-10-24 MED ORDER — SIMETHICONE 80 MG PO CHEW
80.0000 mg | CHEWABLE_TABLET | Freq: Once | ORAL | Status: AC
  Administered 2019-10-24: 80 mg via ORAL
  Filled 2019-10-24: qty 1

## 2019-10-24 MED ORDER — METOPROLOL TARTRATE 5 MG/5ML IV SOLN: 5.0000 mg | Freq: Four times a day (QID) | INTRAVENOUS | Status: DC | PRN

## 2019-10-24 MED ORDER — MAGNESIUM SULFATE 4 GM/100ML IV SOLN
4.0000 g | Freq: Once | INTRAVENOUS | Status: AC
  Administered 2019-10-24: 4 g via INTRAVENOUS
  Filled 2019-10-24: qty 100

## 2019-10-24 NOTE — Progress Notes (Signed)
PROGRESS NOTE    David Gill  SNK:[id number]  DOB: [birth year]  DOA: 10/22/2019 PCP: Olive Bass, MD Outpatient Specialists:   Hospital course:  David Gill is a 59 y.o. male with medical history significant of essential hypertension and remote history of tobacco use presents with complaints of elevated heart rates and anxiousness over the last 2 months.  Symptoms started after he was hospitalized in June at [facility] for a pneumonia.   Symptoms  usually occur while he is sitting up or standing and reports that he has had more episodes in the evening around 5-6 PM.  When he tries to get up it feels like he is on a boat although the room is not spinning around him.  He reports difficulty walking, but also notes that he has pain in his left knee.  He has been evaluated by his primary care provider for the symptoms and he was switched from losartan to metoprolol 50 mg.  At one point they tried increasing to metoprolol 100 mg daily, but the patient did not notice any change in his symptoms.  Notes associated symptoms of intermittent abdominal distention with cramping, change in stool color, lower back pain, sinus congestion, sinus pain, and right lower jaw pain that he suspects may be related to a tooth infection.  He had recently been started on Augmentin to treat for the possibility of a sinus infection without any change in symptoms. CT angiogram of the chest, abdomen, and pelvis did not show any signs of PE.  Imaging did note a mosaic pattern bilaterally concerning for small airway disease and marked right hydronephrosis secondary to what appeared to be chronic UPJ junction obstruction.    Subjective:  Patient states he feels the same as he has been feeling for the past couple of months.  He feels like he is on a floating dock and is slightly off balance.  Admits to a pressure type frontal headache.  Also has pain in his sinuses under his eyes.  Notes the  tachycardia is not very common for him but his other symptoms always start in the evening.  No fevers or chills  Objective: Vitals:   10/23/19 2351 10/24/19 0659 10/24/19 0837 10/24/19 1153  BP: (!) 144/97 (!) 142/92 (!) 138/93 (!) 159/99  Pulse: (!) 102 80 97 97  Resp: 14 20 20 15   Temp: 98.8 F (37.1 C) 98.8 F (37.1 C) 98.6 F (37 C) 98.9 F (37.2 C)  TempSrc: Oral Oral Oral Oral  SpO2: 97% 97% 95% 95%  Weight:      Height:        Intake/Output Summary (Last 24 hours) at 10/24/2019 1442 Last data filed at 10/24/2019 0900 Gross per 24 hour  Intake 390 ml  Output 100 ml  Net 290 ml   Filed Weights   10/23/19 1858  Weight: 85 kg     Exam:  General: Anxious but relatively well-appearing man sitting up in bed in no acute distress Eyes: sclera anicteric, conjuctiva mild injection bilaterally CVS: S1-S2, regular  Respiratory:  decreased air entry bilaterally secondary to decreased inspiratory effort, rales at bases  GI: NABS, soft, NT  LE: No edema.  Neuro: A/O x 3, Moving all extremities equally with normal strength, CN 3-12 intact, grossly nonfocal.  Psych: patient is logical and coherent, judgement and insight appear normal, mood and affect appropriate to situation.   Assessment & Plan:   SIRS  No clear source although WBC has decreased from  16-12 and anion gap has normalized Lactate still around 3 Will order sinus CT given facial pain Panorex pending Patient continues on IV Unasyn  Sinus tachycardia/dizziness/anxiety/headache Will send urine for metanephrines to rule out Pheo Thyroid studies are within normal limits Metoprolol increased to 200 daily after discussion with cardiology yesterday  HTN Losartan/hydrochlorothiazide was held yesterday due to lactic acidosis As noted above metoprolol has been increased to 200 mg, will follow blood pressure closely Echocardiogram shows normal EF with moderate LVH  Hypomagnesemia We will replete and recheck  UPJ  obstruction Chronic with essentially normal kidney function   DVT prophylaxis: Lovenox Code Status: Full Family Communication: Patient states he will talk to his wife Disposition Plan:   Patient is from: Home  Anticipated Discharge Location: Home  Barriers to Discharge: Getting worked up for pheochromocytoma  Is patient medically stable for Discharge: Not yet   Consultants:  None  Procedures:  None  Antimicrobials:  Unasyn   Data Reviewed:  Basic Metabolic Panel: Recent Labs  Lab 10/23/19 0006 10/23/19 1150 10/24/19 0555  NA 137  --  138  K 3.4*  --  3.7  CL 102  --  106  CO2 19*  --  22  GLUCOSE 156*  --  98  BUN 13  --  13  CREATININE 1.24  --  1.13  CALCIUM 10.0  --  8.9  MG  --  1.4*  --    Liver Function Tests: Recent Labs  Lab 10/23/19 0006  AST 35  ALT 63*  ALKPHOS 71  BILITOT 1.0  PROT 7.7  ALBUMIN 4.4   Recent Labs  Lab 10/23/19 0006  LIPASE 38   No results for input(s): AMMONIA in the last 168 hours. CBC: Recent Labs  Lab 10/23/19 0006 10/24/19 0555  WBC 16.4* 11.6*  HGB 17.9* 14.4  HCT 50.5 41.1  MCV 85.7 89.9  PLT 350 280   Cardiac Enzymes: No results for input(s): CKTOTAL, CKMB, CKMBINDEX, TROPONINI in the last 168 hours. BNP (last 3 results) No results for input(s): PROBNP in the last 8760 hours. CBG: No results for input(s): GLUCAP in the last 168 hours.  Recent Results (from the past 240 hour(s))  SARS Coronavirus 2 by RT PCR (hospital order, performed in [facility] hospital lab) Nasopharyngeal Nasopharyngeal Swab     Status: None   Collection Time: 10/23/19  1:45 AM   Specimen: Nasopharyngeal Swab  Result Value Ref Range Status   SARS Coronavirus 2 NEGATIVE NEGATIVE Final    Comment: (NOTE) SARS-CoV-2 target nucleic acids are NOT DETECTED.  The SARS-CoV-2 RNA is generally detectable in upper and lower respiratory specimens during the acute phase of infection. The lowest concentration of SARS-CoV-2 viral  copies this assay can detect is 250 copies / mL. A negative result does not preclude SARS-CoV-2 infection and should not be used as the sole basis for treatment or other patient management decisions.  A negative result may occur with improper specimen collection / handling, submission of specimen other than nasopharyngeal swab, presence of viral mutation(s) within the areas targeted by this assay, and inadequate number of viral copies (<250 copies / mL). A negative result must be combined with clinical observations, patient history, and epidemiological information.  Fact Sheet for Patients:   BoilerBrush.com.cy  Fact Sheet for Healthcare Providers: https://pope.com/  This test is not yet approved or  cleared by the [place] FDA and has been authorized for detection and/or diagnosis of SARS-CoV-2 by FDA under an Emergency Use  Authorization (EUA).  This EUA will remain in effect (meaning this test can be used) for the duration of the COVID-19 declaration under Section 564(b)(1) of the Act, 21 U.S.C. section 360bbb-3(b)(1), unless the authorization is terminated or revoked sooner.  Performed at [facility], [address], [place], [place] [address]   Culture, blood (routine x 2)     Status: None (Preliminary result)   Collection Time: 10/23/19 11:50 AM   Specimen: BLOOD  Result Value Ref Range Status   Specimen Description BLOOD SITE NOT SPECIFIED  Final   Special Requests   Final    BOTTLES DRAWN AEROBIC AND ANAEROBIC Blood Culture adequate volume   Culture   Final    NO GROWTH < 12 HOURS Performed at [facility], [address] [place], [place] [address]    Report Status PENDING  Incomplete  Culture, blood (routine x 2)     Status: None (Preliminary result)   Collection Time: 10/23/19 11:54 AM   Specimen: BLOOD  Result Value Ref Range Status   Specimen Description BLOOD SITE NOT SPECIFIED  Final   Special  Requests   Final    BOTTLES DRAWN AEROBIC AND ANAEROBIC Blood Culture results may not be optimal due to an excessive volume of blood received in culture bottles   Culture   Final    NO GROWTH < 12 HOURS Performed at [facility], [address] [place], [place] [address]    Report Status PENDING  Incomplete  MRSA PCR Screening     Status: None   Collection Time: 10/23/19  7:03 PM   Specimen: Nasopharyngeal  Result Value Ref Range Status   MRSA by PCR NEGATIVE NEGATIVE Final    Comment:        The GeneXpert MRSA Assay (FDA approved for NASAL specimens only), is one component of a comprehensive MRSA colonization surveillance program. It is not intended to diagnose MRSA infection nor to guide or monitor treatment for MRSA infections. Performed at [facility], [address], [place], [place] [address]       Studies: DG Orthopantogram  Result Date: 10/23/2019 CLINICAL DATA:  Dental infection EXAM: ORTHOPANTOGRAM/PANORAMIC COMPARISON:  None. FINDINGS: Panorex view of the mandible demonstrates erosive changes of the right upper molar tooth and left lower premolar and molar. No bony abnormalities. IMPRESSION: 1. Erosive changes of the right upper molar and left lower premolar and molar. Electronically Signed   By: Sharlet Salina M.D.   On: 10/23/2019 19:05   CT ANGIO CHEST PE W OR WO CONTRAST  Result Date: 10/23/2019 CLINICAL DATA:  Abdominal pain, acute. Near syncope, dizziness and palpitations. EXAM: CT ANGIOGRAPHY CHEST CT ABDOMEN AND PELVIS WITH CONTRAST TECHNIQUE: Multidetector CT imaging of the chest was performed using the standard protocol during bolus administration of intravenous contrast. Multiplanar CT image reconstructions and MIPs were obtained to evaluate the vascular anatomy. Multidetector CT imaging of the abdomen and pelvis was performed using the standard protocol during bolus administration of intravenous contrast. CONTRAST:  51mL OMNIPAQUE IOHEXOL 350  MG/ML SOLN COMPARISON:  CT angio chest 08/09/2019 FINDINGS: CTA CHEST FINDINGS Cardiovascular: The main pulmonary artery appears patent. No saddle embolus. No lobar or segmental pulmonary artery filling defects identified. Normal heart size. Mediastinum/Nodes: No enlarged mediastinal, hilar, or axillary lymph nodes. Thyroid gland, trachea, and esophagus demonstrate no significant findings. Lungs/Pleura: No pleural effusion identified. Mild mosaic attenuation pattern is identified bilaterally. No airspace consolidation identified. Musculoskeletal: No acute or suspicious  osseous findings. Review of the MIP images confirms the above findings. CT ABDOMEN and PELVIS FINDINGS Hepatobiliary: Hepatic steatosis. No focal liver abnormality identified. Gallbladder is unremarkable. No bile duct dilatation. Pancreas: Unremarkable. No pancreatic ductal dilatation or surrounding inflammatory changes. Spleen: Normal in size without focal abnormality. Adrenals/Urinary Tract: Normal appearance of the adrenal glands. Prominent left kidney extrarenal pelvis noted. No left kidney hydronephrosis or mass identified. Mild left hydroureter is identified. No left ureteral lithiasis or mass identified. There are signs of marked right hydronephrosis to the level of the UPJ with significant atrophy of normal renal parenchyma. Normal caliber right ureter. Urinary bladder appears unremarkable. Stomach/Bowel: The stomach appears nondistended. The appendix is visualized and appears normal. There is no bowel wall thickening, inflammation or distension. Vascular/Lymphatic: Normal appearance of the abdominal aorta. No aneurysm. No abdominopelvic adenopathy identified. Reproductive: Prostate is unremarkable. Other: No free fluid or fluid collections. Musculoskeletal: No acute or significant osseous findings. Lumbar degenerative disc disease. Review of the MIP images confirms the above findings. IMPRESSION: 1. No evidence for acute pulmonary embolus. 2.  Mild mosaic attenuation pattern identified bilaterally which may reflect small airways disease. 3. Marked right hydronephrosis to the level of the UPJ with significant atrophy of normal renal parenchyma. 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 4. Hepatic steatosis. Electronically Signed   By: Signa Kell M.D.   On: 10/23/2019 05:39   CT ABDOMEN PELVIS W CONTRAST  Result Date: 10/23/2019 CLINICAL DATA:  Abdominal pain, acute. Near syncope, dizziness and palpitations. EXAM: CT ANGIOGRAPHY CHEST CT ABDOMEN AND PELVIS WITH CONTRAST TECHNIQUE: Multidetector CT imaging of the chest was performed using the standard protocol during bolus administration of intravenous contrast. Multiplanar CT image reconstructions and MIPs were obtained to evaluate the vascular anatomy. Multidetector CT imaging of the abdomen and pelvis was performed using the standard protocol during bolus administration of intravenous contrast. CONTRAST:  80mL OMNIPAQUE IOHEXOL 350 MG/ML SOLN COMPARISON:  CT angio chest 08/09/2019 FINDINGS: CTA CHEST FINDINGS Cardiovascular: The main pulmonary artery appears patent. No saddle embolus. No lobar or segmental pulmonary artery filling defects identified. Normal heart size. Mediastinum/Nodes: No enlarged mediastinal, hilar, or axillary lymph nodes. Thyroid gland, trachea, and esophagus demonstrate no significant findings. Lungs/Pleura: No pleural effusion identified. Mild mosaic attenuation pattern is identified bilaterally. No airspace consolidation identified. Musculoskeletal: No acute or suspicious osseous findings. Review of the MIP images confirms the above findings. CT ABDOMEN and PELVIS FINDINGS Hepatobiliary: Hepatic steatosis. No focal liver abnormality identified. Gallbladder is unremarkable. No bile duct dilatation. Pancreas: Unremarkable. No pancreatic ductal dilatation or surrounding inflammatory changes. Spleen: Normal in  size without focal abnormality. Adrenals/Urinary Tract: Normal appearance of the adrenal glands. Prominent left kidney extrarenal pelvis noted. No left kidney hydronephrosis or mass identified. Mild left hydroureter is identified. No left ureteral lithiasis or mass identified. There are signs of marked right hydronephrosis to the level of the UPJ with significant atrophy of normal renal parenchyma. Normal caliber right ureter. Urinary bladder appears unremarkable. Stomach/Bowel: The stomach appears nondistended. The appendix is visualized and appears normal. There is no bowel wall thickening, inflammation or distension. Vascular/Lymphatic: Normal appearance of the abdominal aorta. No aneurysm. No abdominopelvic adenopathy identified. Reproductive: Prostate is unremarkable. Other: No free fluid or fluid collections. Musculoskeletal: No acute or significant osseous findings. Lumbar degenerative disc disease. Review of the MIP images confirms the above findings. IMPRESSION: 1. No evidence for acute pulmonary embolus. 2. Mild mosaic attenuation pattern identified bilaterally which  may reflect small airways disease. 3. Marked right hydronephrosis to the level of the UPJ with significant atrophy of normal renal parenchyma. 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 4. Hepatic steatosis. Electronically Signed   By: Signa Kellaylor  Stroud M.D.   On: 10/23/2019 05:39   DG Chest Port 1 View  Result Date: 10/23/2019 CLINICAL DATA:  Dizziness, palpitations, near syncope EXAM: PORTABLE CHEST 1 VIEW COMPARISON:  08/09/2019 FINDINGS: Two frontal views of the chest demonstrate an unremarkable cardiac silhouette. No airspace disease, effusion, or pneumothorax. No acute bony abnormalities. IMPRESSION: 1. No acute intrathoracic process. Electronically Signed   By: Sharlet SalinaMichael  Brown M.D.   On: 10/23/2019 02:45   ECHOCARDIOGRAM COMPLETE  Result Date: 10/23/2019     ECHOCARDIOGRAM REPORT   Patient Name:   Fanny DanceJEFFREY V Byus Date of Exam: 10/23/2019 Medical Rec #:  [id number]             Height: Accession #:    [id number]            Weight: Date of Birth:  10-01-1960             BSA: Patient Age:    59 years              BP:           147/84 mmHg Patient Gender: M                     HR:           121 bpm. Exam Location:  Inpatient Procedure: 2D Echo, Cardiac Doppler and Color Doppler Indications:    Palpitations  History:        Patient has no prior history of Echocardiogram examinations.                 Tachycardia.  Sonographer:    Ross LudwigArthur Guy RDCS (AE) Referring Phys: [id number] [facility]  Sonographer Comments: No subcostal window. IMPRESSIONS  1. Left ventricular ejection fraction, by estimation, is 65 to 70%. The left ventricle has normal function. The left ventricle has no regional wall motion abnormalities. There is moderate left ventricular hypertrophy. Left ventricular diastolic parameters are consistent with Grade I diastolic dysfunction (impaired relaxation).  2. Right ventricular systolic function is normal. The right ventricular size is normal.  3. The mitral valve is normal in structure. No evidence of mitral valve regurgitation. No evidence of mitral stenosis.  4. The aortic valve is normal in structure. Aortic valve regurgitation is not visualized. No aortic stenosis is present.  5. The inferior vena cava is normal in size with greater than 50% respiratory variability, suggesting right atrial pressure of 3 mmHg. FINDINGS  Left Ventricle: Left ventricular ejection fraction, by estimation, is 65 to 70%. The left ventricle has normal function. The left ventricle has no regional wall motion abnormalities. The left ventricular internal cavity size was normal in size. There is  moderate left ventricular hypertrophy. Left ventricular diastolic parameters are consistent with Grade I diastolic dysfunction (impaired relaxation). Right Ventricle: The right ventricular  size is normal. No increase in right ventricular wall thickness. Right ventricular systolic function is normal. Left Atrium: Left atrial size was normal in size. Right Atrium: Right atrial size was normal in size. Pericardium: There is no evidence of pericardial effusion. Mitral Valve: The mitral valve is normal in structure. No evidence of mitral valve regurgitation. No evidence of mitral valve stenosis. Tricuspid Valve: The  tricuspid valve is normal in structure. Tricuspid valve regurgitation is not demonstrated. No evidence of tricuspid stenosis. Aortic Valve: The aortic valve is normal in structure. Aortic valve regurgitation is not visualized. No aortic stenosis is present. Aortic valve mean gradient measures 3.0 mmHg. Aortic valve peak gradient measures 6.2 mmHg. Aortic valve area, by VTI measures 2.77 cm. Pulmonic Valve: The pulmonic valve was normal in structure. Pulmonic valve regurgitation is trivial. No evidence of pulmonic stenosis. Aorta: The aortic root is normal in size and structure. Venous: The inferior vena cava is normal in size with greater than 50% respiratory variability, suggesting right atrial pressure of 3 mmHg. IAS/Shunts: No atrial level shunt detected by color flow Doppler.  LEFT VENTRICLE PLAX 2D LVIDd:         2.80 cm LVIDs:         1.90 cm LV PW:         1.80 cm LV IVS:        1.70 cm LVOT diam:     2.00 cm LV SV:         59 LVOT Area:     3.14 cm  RIGHT VENTRICLE RV Basal diam:  2.30 cm RV S prime:     18.35 cm/s TAPSE (M-mode): 2.5 cm LEFT ATRIUM             RIGHT ATRIUM LA diam:        3.00 cm RA Area:     12.10 cm LA Vol (A2C):   37.4 ml RA Volume:   26.80 ml LA Vol (A4C):   32.0 ml LA Biplane Vol: 35.2 ml  AORTIC VALVE AV Area (Vmax):    2.94 cm AV Area (Vmean):   2.81 cm AV Area (VTI):     2.77 cm AV Vmax:           125.00 cm/s AV Vmean:          87.300 cm/s AV VTI:            0.212 m AV Peak Grad:      6.2 mmHg AV Mean Grad:      3.0 mmHg LVOT Vmax:         117.00 cm/s  LVOT Vmean:        78.100 cm/s LVOT VTI:          0.187 m LVOT/AV VTI ratio: 0.88  AORTA Ao Root diam: 3.10 cm Ao Asc diam:  3.00 cm  SHUNTS Systemic VTI:  0.19 m Systemic Diam: 2.00 cm Donato Schultz MD Electronically signed by Donato Schultz MD Signature Date/Time: 10/23/2019/2:03:14 PM    Final      Scheduled Meds: . diclofenac Sodium  2 g Topical QID  . enoxaparin (LOVENOX) injection  40 mg Subcutaneous Q24H  . fluticasone  1 spray Each Nare Daily  . guaiFENesin  600 mg Oral BID  . loratadine  10 mg Oral Daily  . metoprolol succinate  100 mg Oral Daily  . sodium chloride flush  3 mL Intravenous Q12H   Continuous Infusions: . ampicillin-sulbactam (UNASYN) IV 3 g (10/24/19 1146)    Principal Problem:   SIRS (systemic inflammatory response syndrome) (HCC) Active Problems:   Tachycardia   Essential hypertension   Hypokalemia   Metabolic acidosis with increased anion gap and accumulation of organic acids   Dizziness   Anxiety   Osteoarthritis     Sriman Tally Orma Flaming, [facility]  If 7PM-7AM, please contact night-coverage [email or web address] Password [email or web address] 10/24/2019, 2:42 PM  LOS: 0 days

## 2019-10-24 NOTE — Evaluation (Signed)
Physical Therapy Evaluation Patient Details Name: David Gill MRN: [id number] DOB: [birth year] Today's Date: 10/24/2019   History of Present Illness  David Gill is a 59 y.o. male with medical history significant of essential hypertension and remote history of tobacco use presents with complaints of elevated heart rates and anxiousness over the last 2 months.  Symptoms started after he was hospitalized in June at [facility] for a pneumonia.   Symptoms  usually occur while he is sitting up or standing and reports that he has had more episodes in the evening around 5-6 PM.  When he tries to get up it feels like he is on a boat although the room is not spinning around him.   Clinical Impression  Pt admitted with above diagnosis. Pt tested for BPPV based on symptoms reported.  Treated for right BPPV with canalith repositioning maneuver with good results it seems. Pt was able to walk still reporting slight unsteady gait but he feels like it may be a little better.  Will follow acutely and progress pt as tolerated.  Pt currently with functional limitations due to the deficits listed below (see PT Problem List). Pt will benefit from skilled PT to increase their independence and safety with mobility to allow discharge to the venue listed below.      Follow Up Recommendations Outpatient PT;Supervision - Intermittent (Vestibular rehab)    Equipment Recommendations  None recommended by PT    Recommendations for Other Services       Precautions / Restrictions Precautions Precautions: Fall Restrictions Weight Bearing Restrictions: No      Mobility  Bed Mobility Overal bed mobility: Independent                Transfers Overall transfer level: Needs assistance Equipment used: None Transfers: Sit to/from Stand Sit to Stand: Supervision            Ambulation/Gait Ambulation/Gait assistance: Min guard Gait Distance (Feet): 250 Feet Assistive device:  None Gait Pattern/deviations: Step-through pattern;Decreased stride length   Gait velocity interpretation: <1.31 ft/sec, indicative of household ambulator General Gait Details: Pt was able to ambulate without device with steady gait overall. States that he feels better after maneuver for BPPV but still has twinges of feeling like he is on a boat.  Will follow up tomorrow to reassess.    Stairs            Wheelchair Mobility    Modified Rankin (Stroke Patients Only)       Balance Overall balance assessment: Needs assistance         Standing balance support: No upper extremity supported;During functional activity Standing balance-Leahy Scale: Fair Standing balance comment: can stand statically without UE support but needs a little support dynamically.                              Pertinent Vitals/Pain Pain Assessment: Faces Faces Pain Scale: Hurts even more Pain Location: left hip and knee Pain Descriptors / Indicators: Discomfort;Burning Pain Intervention(s): Limited activity within patient's tolerance;Monitored during session;Repositioned    Home Living Family/patient expects to be discharged to:: Private residence Living Arrangements: Spouse/significant other Available Help at Discharge: Family;Available 24 hours/day (wife works from home) Type of Home: House Home Access: Level entry     Home Layout: One level;Laundry or work area in [employer]: None      Prior Function Level of Independence: Independent  Comments: For the last 6 weeks ever since new meds were introduced and changed, pt reports balance issues     Hand Dominance   Dominant Hand: Left    Extremity/Trunk Assessment   Upper Extremity Assessment Upper Extremity Assessment: Defer to OT evaluation    Lower Extremity Assessment Lower Extremity Assessment: Generalized weakness    Cervical / Trunk Assessment Cervical / Trunk Assessment: Normal   Communication   Communication: No difficulties  Cognition Arousal/Alertness: Awake/alert Behavior During Therapy: WFL for tasks assessed/performed Overall Cognitive Status: Within Functional Limits for tasks assessed                                        General Comments General comments (skin integrity, edema, etc.): 104 bpm, 171/101 supine; 184/109 with HR 110 bpm sitting; 169/104, 110 bpm standing; 178/103, 112 bpm standing after 3 min. Nurse is aware of BPs.  Assessed pt for BPPV based on his report of symptoms.  Treated for right posterior canal BPPV and pt reports he felt less dizzy and less off balance after treatment.     Exercises     Assessment/Plan    PT Assessment Patient needs continued PT services  PT Problem List Decreased balance;Decreased activity tolerance;Decreased mobility;Decreased knowledge of use of DME;Decreased safety awareness (dizziness)       PT Treatment Interventions DME instruction;Gait training;Functional mobility training;Therapeutic activities;Therapeutic exercise;Balance training;Patient/family education (canalith repositioning maneuver)    PT Goals (Current goals can be found in the Care Plan section)  Acute Rehab PT Goals Patient Stated Goal: to go home PT Goal Formulation: With patient Time For Goal Achievement: 11/07/19 Potential to Achieve Goals: Good    Frequency Min 3X/week   Barriers to discharge        Co-evaluation               AM-PAC PT "6 Clicks" Mobility  Outcome Measure Help needed turning from your back to your side while in a flat bed without using bedrails?: None Help needed moving from lying on your back to sitting on the side of a flat bed without using bedrails?: None Help needed moving to and from a bed to a chair (including a wheelchair)?: None Help needed standing up from a chair using your arms (e.g., wheelchair or bedside chair)?: A Little Help needed to walk in hospital room?: A  Little Help needed climbing 3-5 steps with a railing? : A Little 6 Click Score: 21    End of Session Equipment Utilized During Treatment: Gait belt Activity Tolerance: Patient limited by fatigue Patient left: in chair;with call bell/phone within reach;with chair alarm set Nurse Communication: Mobility status PT Visit Diagnosis: Dizziness and giddiness (R42);BPPV;Muscle weakness (generalized) (M62.81) BPPV - Right/Left : Right    Time: 7654-6503 PT Time Calculation (min) (ACUTE ONLY): 42 min   Charges:   PT Evaluation $PT Eval Moderate Complexity: 1 Mod PT Treatments $Gait Training: 8-22 mins $Canalith Rep Proc: 8-22 mins        Oluwatobiloba Martin W,PT Acute Rehabilitation Services Pager:  [phone]  Office:  [phone]    Berline Lopes 10/24/2019, 1:22 PM

## 2019-10-24 NOTE — Progress Notes (Signed)
   10/24/19 2038  Vitals  Temp 98.5 F (36.9 C)  BP (!) 165/101  MAP (mmHg) 120  BP Location Left Arm  BP Method Automatic  Patient Position (if appropriate) Lying  Pulse Rate 98  Pulse Rate Source Monitor  ECG Heart Rate 98  Resp 14  MEWS COLOR  MEWS Score Color Green  Oxygen Therapy  SpO2 98 %  O2 Device Room Air  MEWS Score  MEWS Temp 0  MEWS Systolic 0  MEWS Pulse 0  MEWS RR 0  MEWS LOC 0  MEWS Score 0  Rathore MD notified of BP 165/101, patient in no distress will continue to monitor.

## 2019-10-25 LAB — CBC WITH DIFFERENTIAL/PLATELET
Abs Immature Granulocytes: 0.14 10*3/uL — ABNORMAL HIGH (ref 0.00–0.07)
Basophils Absolute: 0.1 10*3/uL (ref 0.0–0.1)
Basophils Relative: 1 %
Eosinophils Absolute: 0.4 10*3/uL (ref 0.0–0.5)
Eosinophils Relative: 4 %
HCT: 42 % (ref 39.0–52.0)
Hemoglobin: 14.8 g/dL (ref 13.0–17.0)
Immature Granulocytes: 1 %
Lymphocytes Relative: 28 %
Lymphs Abs: 3.3 10*3/uL (ref 0.7–4.0)
MCH: 30.8 pg (ref 26.0–34.0)
MCHC: 35.2 g/dL (ref 30.0–36.0)
MCV: 87.5 fL (ref 80.0–100.0)
Monocytes Absolute: 0.9 10*3/uL (ref 0.1–1.0)
Monocytes Relative: 7 %
Neutro Abs: 7.3 10*3/uL (ref 1.7–7.7)
Neutrophils Relative %: 59 %
Platelets: 296 10*3/uL (ref 150–400)
RBC: 4.8 MIL/uL (ref 4.22–5.81)
RDW: 11.7 % (ref 11.5–15.5)
WBC: 12.2 10*3/uL — ABNORMAL HIGH (ref 4.0–10.5)
nRBC: 0 % (ref 0.0–0.2)

## 2019-10-25 LAB — BASIC METABOLIC PANEL
Anion gap: 11 (ref 5–15)
BUN: 9 mg/dL (ref 6–20)
CO2: 22 mmol/L (ref 22–32)
Calcium: 8.7 mg/dL — ABNORMAL LOW (ref 8.9–10.3)
Chloride: 107 mmol/L (ref 98–111)
Creatinine, Ser: 1.1 mg/dL (ref 0.61–1.24)
GFR calc Af Amer: >60 mL/min (ref >60)
GFR calc non Af Amer: >60 mL/min (ref >60)
Glucose, Bld: 127 mg/dL — ABNORMAL HIGH (ref 70–99)
Potassium: 3.4 mmol/L — ABNORMAL LOW (ref 3.5–5.1)
Sodium: 140 mmol/L (ref 135–145)

## 2019-10-25 LAB — T3, FREE: T3, Free: 3.3 pg/mL (ref 2.0–4.4)

## 2019-10-25 LAB — MAGNESIUM: Magnesium: 2.5 mg/dL — ABNORMAL HIGH (ref 1.7–2.4)

## 2019-10-25 MED ORDER — INFLUENZA VAC SPLIT QUAD 0.5 ML IM SUSY
0.5000 mL | PREFILLED_SYRINGE | INTRAMUSCULAR | Status: AC
  Administered 2019-10-26: 0.5 mL via INTRAMUSCULAR
  Filled 2019-10-25: qty 0.5

## 2019-10-25 MED ORDER — POTASSIUM CHLORIDE CRYS ER 20 MEQ PO TBCR
40.0000 meq | EXTENDED_RELEASE_TABLET | Freq: Once | ORAL | Status: AC
  Administered 2019-10-25: 40 meq via ORAL
  Filled 2019-10-25: qty 2

## 2019-10-25 MED ORDER — HYDROCHLOROTHIAZIDE 25 MG PO TABS
25.0000 mg | ORAL_TABLET | Freq: Every day | ORAL | Status: DC
  Administered 2019-10-25 – 2019-10-26 (×2): 25 mg via ORAL
  Filled 2019-10-25 (×2): qty 1

## 2019-10-25 NOTE — Progress Notes (Addendum)
Mobility Specialist: Progress Note   10/25/19 1724  Mobility  Activity Ambulated in hall  Level of Assistance Independent  Assistive Device  (IV Pole)  Distance Ambulated (ft) 170 ft  Mobility Response Tolerated well  Mobility performed by Mobility specialist  Bed Position Semi-fowlers  $Mobility charge 1 Mobility   Pre-Mobility: 77 HR, 100% SpO2 Post-Mobility: 100 HR, 177/110 BP, 99% SpO2  Pt had to stop to take two standing rest breaks due to feeling dizzy, resolved quickly. Pt said he has vertigo and has been doing the exercises the PT has shown him to help.   [facility] Matalyn Nawaz Mobility Specialist

## 2019-10-25 NOTE — Progress Notes (Signed)
Physical Therapy Treatment Patient Details Name: David Gill MRN: [id number] DOB: Jan 21, 1961 Today's Date: 10/25/2019    History of Present Illness David Gill is a 59 y.o. male with medical history significant of essential hypertension and remote history of tobacco use presents with complaints of elevated heart rates and anxiousness over the last 2 months.  Symptoms started after he was hospitalized in June at [facility] for a pneumonia.   Symptoms  usually occur while he is sitting up or standing and reports that he has had more episodes in the evening around 5-6 PM.  When he tries to get up it feels like he is on a boat although the room is not spinning around him.     PT Comments    Pt admitted with above diagnosis. Pt was able to ambulate without device after Epley maneuver performed and states that symptoms are almost gone after the treatments over the last 2 days.  Pt able to withstand challenges to balance with ambualtion without LOB. Will return to go over exercises with pt.  Pt currently with functional limitations due to balance and endurance deficits. Pt will benefit from skilled PT to increase their independence and safety with mobility to allow discharge to the venue listed below.     Follow Up Recommendations  Outpatient PT;Supervision - Intermittent (Vestibular rehab)     Equipment Recommendations  None recommended by PT    Recommendations for Other Services       Precautions / Restrictions Precautions Precautions: Fall Restrictions Weight Bearing Restrictions: No    Mobility  Bed Mobility Overal bed mobility: Independent                Transfers Overall transfer level: Needs assistance Equipment used: None Transfers: Sit to/from Stand Sit to Stand: Supervision            Ambulation/Gait Ambulation/Gait assistance: Min guard Gait Distance (Feet): 350 Feet Assistive device: None Gait Pattern/deviations: Step-through  pattern;Decreased stride length   Gait velocity interpretation: <1.31 ft/sec, indicative of household ambulator General Gait Details: Pt was able to ambulate without device with steady gait overall. States his dizziness is so much better. HE only gets dizzy with greater challenges at this point and does not feel like he is constantly on a boat now.    Stairs             Wheelchair Mobility    Modified Rankin (Stroke Patients Only)       Balance Overall balance assessment: Needs assistance Sitting-balance support: No upper extremity supported;Feet supported Sitting balance-Leahy Scale: Fair     Standing balance support: No upper extremity supported;During functional activity Standing balance-Leahy Scale: Fair Standing balance comment: can stand statically without UE support and dynamically with min guard assist.              High level balance activites: Direction changes;Turns;Sudden stops;Head turns High Level Balance Comments: Pt able to perform above without LOB and min guard assist.             Cognition Arousal/Alertness: Awake/alert Behavior During Therapy: WFL for tasks assessed/performed Overall Cognitive Status: Within Functional Limits for tasks assessed                                        Exercises      General Comments General comments (skin integrity, edema, etc.): Pt reassessed for BPPV and  symptoms not present however since testing went ahead and performed Epley once more for right BPPV to ensure crystals moved.  Informed pt that this PT would bring back exercises for him to work on.       Pertinent Vitals/Pain Pain Assessment: Faces Faces Pain Scale: Hurts even more Pain Location: left hip and knee Pain Descriptors / Indicators: Discomfort;Burning Pain Intervention(s): Limited activity within patient's tolerance;Monitored during session;Repositioned    Home Living                      Prior Function             PT Goals (current goals can now be found in the care plan section) Acute Rehab PT Goals Patient Stated Goal: to go home Progress towards PT goals: Progressing toward goals    Frequency    Min 3X/week      PT Plan Current plan remains appropriate    Co-evaluation              AM-PAC PT "6 Clicks" Mobility   Outcome Measure  Help needed turning from your back to your side while in a flat bed without using bedrails?: None Help needed moving from lying on your back to sitting on the side of a flat bed without using bedrails?: None Help needed moving to and from a bed to a chair (including a wheelchair)?: None Help needed standing up from a chair using your arms (e.g., wheelchair or bedside chair)?: A Little Help needed to walk in hospital room?: A Little Help needed climbing 3-5 steps with a railing? : A Little 6 Click Score: 21    End of Session Equipment Utilized During Treatment: Gait belt Activity Tolerance: Patient limited by fatigue Patient left: in chair;with call bell/phone within reach;with chair alarm set Nurse Communication: Mobility status PT Visit Diagnosis: Dizziness and giddiness (R42);BPPV;Muscle weakness (generalized) (M62.81) BPPV - Right/Left : Right     Time: 6578-4696 PT Time Calculation (min) (ACUTE ONLY): 18 min  Charges:  $Canalith Rep Proc: 8-22 mins                     Delynn Pursley W,PT Acute Rehabilitation Services Pager:  [phone]  Office:  [phone]     Berline Lopes 10/25/2019, 12:16 PM

## 2019-10-25 NOTE — Progress Notes (Signed)
10/25/19 1216  PT Visit Information  Last PT Received On 10/25/19  Assistance Needed +1  History of Present Illness David Gill is a 59 y.o. male with medical history significant of essential hypertension and remote history of tobacco use presents with complaints of elevated heart rates and anxiousness over the last 2 months.  Symptoms started after he was hospitalized in June at [facility] for a pneumonia.   Symptoms  usually occur while he is sitting up or standing and reports that he has had more episodes in the evening around 5-6 PM.  When he tries to get up it feels like he is on a boat although the room is not spinning around him.   Subjective Data  Patient Stated Goal to go home  Precautions  Precautions Fall  Restrictions  Weight Bearing Restrictions No  Cognition  Arousal/Alertness Awake/alert  Behavior During Therapy WFL for tasks assessed/performed  Overall Cognitive Status Within Functional Limits for tasks assessed  Bed Mobility  Overal bed mobility Independent  Transfers  Overall transfer level Needs assistance  Equipment used None  Transfers Sit to/from Stand  Sit to Stand Supervision  Balance  Standing balance support No upper extremity supported;During functional activity  Standing balance-Leahy Scale Fair  Standing balance comment can stand statically without UE support and dynamically with min guard assist.   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    Exercises  Exercises Other exercises  Other Exercises  Other Exercises Returned to show pt and wife HEP to include David Gill (pt aware to only perform if he has return of same symptoms that last seconds).  Other Exercises Pt also given x1 exercises to perform in sitting and progress to standing and walking.  Pt  aware to perform head nods and shakes with head nods being more difficult currently.   Other Exercises wife present and educated pt and wife as to all aspects for BPPV.    Other Exercises Gave [employer] handout [id number] to pt.   PT - End of Session  Activity Tolerance Patient tolerated treatment well  Patient left in chair;with call bell/phone within reach;with chair alarm set;with family/visitor present  Nurse Communication Mobility status   PT - Assessment/Plan  PT Plan Current plan remains appropriate  PT Visit Diagnosis Dizziness and giddiness (R42);BPPV;Muscle weakness (generalized) (M62.81)  BPPV - Right/Left  Right  PT Frequency (ACUTE ONLY) Min 3X/week  Follow Up Recommendations Outpatient PT;Supervision - Intermittent (Vestibular rehab)  PT equipment None recommended by PT  AM-PAC PT "6 Clicks" Mobility Outcome Measure (Version 2)  Help needed turning from your back to your side while in a flat bed without using bedrails? 4  Help needed moving from lying on your back to sitting on the side of a flat bed without using bedrails? 4  Help needed moving to and from a bed to a chair (including a wheelchair)? 4  Help needed standing up from a chair using your arms (e.g., wheelchair or bedside chair)? 3  Help needed to walk in hospital room? 3  Help needed climbing 3-5 steps with a railing?  3  6 Click Score 21  Consider Recommendation of Discharge To: Home with no services  PT Goal Progression  Progress towards PT goals Progressing toward goals  PT Time Calculation  PT Start Time (  ACUTE ONLY) 0951  PT Stop Time (ACUTE ONLY) 1021  PT Time Calculation (min) (ACUTE ONLY) 30 min  PT General Charges  $$ ACUTE PT VISIT 1 Visit  PT Treatments  $Therapeutic Exercise 23-37 mins  Pt was able to tolerate exercises.  Educated pt and wife and answered all questions.  They understand how to perform exercises and to follow up with Outpt PT as well for continued vestibular therapy.   David Gill  W,PT Acute Rehabilitation Services Pager:  [phone]  Office:  [phone]

## 2019-10-25 NOTE — Progress Notes (Addendum)
PROGRESS NOTE    David Gill  HKV:[id number]  DOB: August 02, 1960  DOA: 10/22/2019 PCP: Olive Bass, MD Outpatient Specialists:   Hospital course:  David Gill is a 59 y.o. male with medical history significant of essential hypertension and remote history of tobacco use presents with complaints of elevated heart rates and anxiousness over the last 2 months.  Symptoms started after he was hospitalized in June at [facility] for a pneumonia.   Symptoms  usually occur while he is sitting up or standing and reports that he has had more episodes in the evening around 5-6 PM.  When he tries to get up it feels like he is on a boat although the room is not spinning around him.  He reports difficulty walking, but also notes that he has pain in his left knee.  He has been evaluated by his primary care provider for the symptoms and he was switched from losartan to metoprolol 50 mg.  At one point they tried increasing to metoprolol 100 mg daily, but the patient did not notice any change in his symptoms.  Notes associated symptoms of intermittent abdominal distention with cramping, change in stool color, lower back pain, sinus congestion, sinus pain, and right lower jaw pain that he suspects may be related to a tooth infection.  He had recently been started on Augmentin to treat for the possibility of a sinus infection without any change in symptoms. CT angiogram of the chest, abdomen, and pelvis did not show any signs of PE.  Imaging did note a mosaic pattern bilaterally concerning for small airway disease and marked right hydronephrosis secondary to what appeared to be chronic UPJ junction obstruction.    Subjective:  Patient states he feels much better after working with physical therapy.  Discussion with physical therapy reveals patient underwent Epley maneuver and almost immediate improvement.  Patient notes he no longer feels dizzy or feels like he is on a dock.  Also notes  headache is improved.   Objective: Vitals:   10/25/19 0032 10/25/19 0452 10/25/19 0839 10/25/19 1234  BP: (!) 162/111 137/89 (!) 162/103 (!) 151/103  Pulse: 85 78 91 72  Resp: 12 19 12 19   Temp: 98.7 F (37.1 C) 98.4 F (36.9 C) 98.6 F (37 C) 98.4 F (36.9 C)  TempSrc: Oral Oral Oral Oral  SpO2: 97% 96% 99% 97%  Weight:      Height:        Intake/Output Summary (Last 24 hours) at 10/25/2019 1613 Last data filed at 10/25/2019 1244 Gross per 24 hour  Intake 540 ml  Output 250 ml  Net 290 ml   Filed Weights   10/23/19 1858  Weight: 85 kg     Exam:  General: Patient looking less anxious sitting on side of bed eating with attentive and somewhat anxious wife at bedside.  Eyes: sclera anicteric, conjuctiva mild injection bilaterally CVS: S1-S2, regular  Respiratory:  decreased air entry bilaterally secondary to decreased inspiratory effort, rales at bases  GI: NABS, soft, NT  LE: No edema.  Neuro: A/O x 3, Moving all extremities equally with normal strength, CN 3-12 intact, grossly nonfocal.  Psych: patient is logical and coherent, judgement and insight appear normal, mood and affect appropriate to situation.   Assessment & Plan:   SIRS  No clear source, symptoms are improving with hydration and IV Unasyn Sinus CT shows only dental caries as was seen on the Panorex  Sinus tachycardia/dizziness/anxiety/headache Patient had no further  episodes of tachycardia while he has been on the metoprolol 100 daily 24-hour urine for metanephrines is being collected to rule out Pheo Patient should be able to go home after urine collection is complete tomorrow, can follow-up with PCP as an outpatient who can follow-up on results of the urine collection. 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  CT chest with small airway disease Can be followed up with PCP if patient becomes symptomatic   DVT prophylaxis: Lovenox Code Status: Full Family Communication: Patient states he will talk to his wife Disposition Plan:   Patient is from: Home  Anticipated Discharge Location: Home  Barriers to Discharge: Getting worked up for pheochromocytoma  Is patient medically stable for Discharge: Not yet   Consultants:  None  Procedures:  None  Antimicrobials:  Unasyn   Data Reviewed:  Basic Metabolic Panel: Recent Labs  Lab 10/23/19 0006 10/23/19 1150 10/24/19 0555 10/25/19 0124  NA 137  --  138 140  K 3.4*  --  3.7 3.4*  CL 102  --  106 107  CO2 19*  --  22 22  GLUCOSE 156*  --  98 127*  BUN 13  --  13 9  CREATININE 1.24  --  1.13 1.10  CALCIUM 10.0  --  8.9 8.7*  MG  --  1.4*  --  2.5*   Liver Function Tests: Recent Labs  Lab 10/23/19 0006  AST 35  ALT 63*  ALKPHOS 71  BILITOT 1.0  PROT 7.7  ALBUMIN 4.4   Recent Labs  Lab 10/23/19 0006  LIPASE 38   No results for input(s): AMMONIA in the last 168 hours. CBC: Recent Labs  Lab 10/23/19 0006 10/24/19 0555 10/25/19 0124  WBC 16.4* 11.6* 12.2*  NEUTROABS  --   --  7.3  HGB 17.9* 14.4 14.8  HCT 50.5 41.1 42.0  MCV 85.7 89.9 87.5  PLT 350 280 296   Cardiac Enzymes: No results for input(s): CKTOTAL, CKMB, CKMBINDEX, TROPONINI in the last 168 hours. BNP (last 3 results) No results for input(s): PROBNP in the last 8760 hours. CBG: No results for input(s): GLUCAP in the last 168 hours.  Recent Results (from the past 240 hour(s))   SARS Coronavirus 2 by RT PCR (hospital order, performed in [facility] hospital lab) Nasopharyngeal Nasopharyngeal Swab     Status: None   Collection Time: 10/23/19  1:45 AM   Specimen: Nasopharyngeal Swab  Result Value Ref Range Status   SARS Coronavirus 2 NEGATIVE NEGATIVE Final    Comment: (NOTE) SARS-CoV-2 target nucleic acids are NOT DETECTED.  The SARS-CoV-2 RNA is generally detectable in upper and lower respiratory specimens during the acute phase of infection. The lowest concentration of SARS-CoV-2 viral copies this assay can detect is 250 copies / mL. A negative result does not preclude SARS-CoV-2 infection and should not be used as the sole basis for treatment or other patient management decisions.  A negative result may occur with improper specimen collection / handling, submission of specimen other  than nasopharyngeal swab, presence of viral mutation(s) within the areas targeted by this assay, and inadequate number of viral copies (<250 copies / mL). A negative result must be combined with clinical observations, patient history, and epidemiological information.  Fact Sheet for Patients:   BoilerBrush.com.cyhttps://www.fda.gov/media/136312/download  Fact Sheet for Healthcare Providers: https://pope.com/https://www.fda.gov/media/136313/download  This test is not yet approved or  cleared by the [place] FDA and has been authorized for detection and/or diagnosis of SARS-CoV-2 by FDA under an Emergency Use Authorization (EUA).  This EUA will remain in effect (meaning this test can be used) for the duration of the COVID-19 declaration under Section 564(b)(1) of the Act, 21 U.S.C. section 360bbb-3(b)(1), unless the authorization is terminated or revoked sooner.  Performed at [facility], [address] [place], [place] [address]   Culture, blood (routine x 2)     Status: None (Preliminary result)   Collection Time: 10/23/19 11:50 AM   Specimen: BLOOD  Result Value Ref Range Status    Specimen Description BLOOD SITE NOT SPECIFIED  Final   Special Requests   Final    BOTTLES DRAWN AEROBIC AND ANAEROBIC Blood Culture adequate volume   Culture   Final    NO GROWTH 2 DAYS Performed at [facility], [address] [place], [place] [address]    Report Status PENDING  Incomplete  Culture, blood (routine x 2)     Status: None (Preliminary result)   Collection Time: 10/23/19 11:54 AM   Specimen: BLOOD  Result Value Ref Range Status   Specimen Description BLOOD SITE NOT SPECIFIED  Final   Special Requests   Final    BOTTLES DRAWN AEROBIC AND ANAEROBIC Blood Culture results may not be optimal due to an excessive volume of blood received in culture bottles   Culture   Final    NO GROWTH 2 DAYS Performed at [facility], [address] [place], [place] [address]    Report Status PENDING  Incomplete  MRSA PCR Screening     Status: None   Collection Time: 10/23/19  7:03 PM   Specimen: Nasopharyngeal  Result Value Ref Range Status   MRSA by PCR NEGATIVE NEGATIVE Final    Comment:        The GeneXpert MRSA Assay (FDA approved for NASAL specimens only), is one component of a comprehensive MRSA colonization surveillance program. It is not intended to diagnose MRSA infection nor to guide or monitor treatment for MRSA infections. Performed at [facility], [address] [place], [place] [address]       Studies: DG Orthopantogram  Result Date: 10/23/2019 CLINICAL DATA:  Dental infection EXAM: ORTHOPANTOGRAM/PANORAMIC COMPARISON:  None. FINDINGS: Panorex view of the mandible demonstrates erosive changes of the right upper molar tooth and left lower premolar and molar. No bony abnormalities. IMPRESSION: 1. Erosive changes of the right upper molar and left lower premolar and molar. Electronically Signed   By: Sharlet SalinaMichael  Brown M.D.   On: 10/23/2019 19:05   CT MAXILLOFACIAL WO CONTRAST  Result Date: 10/24/2019 CLINICAL DATA:  59 year old male is off  balance, tachycardia, sinus pain, right lower jaw pain recently started on Augmentin for possible tooth infection. EXAM: CT MAXILLOFACIAL WITHOUT CONTRAST TECHNIQUE: Multidetector CT imaging of the maxillofacial structures was performed. Multiplanar CT image reconstructions were also generated. COMPARISON:  [facility] brain [facility] 10/30/2011 (no images available). FINDINGS: Osseous: Carious residual left mandible bicuspid and molar. Carious contralateral  right maxillary anterior molar, and the posterior right mandible molar demonstrates abnormal periapical lucency (series 5, image 33). But no definite overlying soft tissue inflammation is identified. Mandible is normally located. No facial fracture identified. Visible calvarium, central skull base, and cervical vertebrae appear intact. Orbits: Intact orbital walls. Symmetric and negative noncontrast orbits soft tissues aside from Disconjugate gaze. Sinuses: Paranasal sinuses are clear throughout. Unremarkable nasal cavity. Tympanic cavities and mastoids are clear. Soft tissues: Negative visible noncontrast thyroid, larynx, pharynx, parapharyngeal spaces, retropharyngeal space, sublingual space, submandibular spaces, masticator spaces, and parotid spaces. No upper cervical lymphadenopathy. Limited intracranial: Negative visible noncontrast brain parenchyma. IMPRESSION: 1. Carious posterior dentition, including posterior right mandible molar with abnormal periapical lucency, but no associated soft tissue inflammation identified. 2. Otherwise negative noncontrast Face CT. Sinuses and middle ears are clear. Electronically Signed   By: Odessa Fleming M.D.   On: 10/24/2019 15:51     Scheduled Meds: . diclofenac Sodium  2 g Topical QID  . enoxaparin (LOVENOX) injection  40 mg Subcutaneous Q24H  . fluticasone  1 spray Each Nare Daily  . guaiFENesin  600 mg Oral BID  . loratadine  10 mg Oral Daily  . metoprolol succinate   100 mg Oral Daily  . sodium chloride flush  3 mL Intravenous Q12H   Continuous Infusions: . ampicillin-sulbactam (UNASYN) IV 3 g (10/25/19 1252)    Principal Problem:   SIRS (systemic inflammatory response syndrome) (HCC) Active Problems:   Tachycardia   Essential hypertension   Hypokalemia   Metabolic acidosis with increased anion gap and accumulation of organic acids   Dizziness   Anxiety   Osteoarthritis     Kynlee Koenigsberg Orma Flaming, [facility]  If 7PM-7AM, please contact night-coverage [email or web address] Password [email or web address] 10/25/2019, 4:13 PM    LOS: 1 day

## 2019-10-26 MED ORDER — HYDROCHLOROTHIAZIDE 25 MG PO TABS: 25.0000 mg | ORAL_TABLET | Freq: Every day | ORAL | 0 refills | Status: DC

## 2019-10-26 MED ORDER — HYDROXYZINE HCL 25 MG PO TABS: 25.0000 mg | ORAL_TABLET | Freq: Three times a day (TID) | ORAL | 0 refills | Status: DC | PRN

## 2019-10-26 MED ORDER — METOPROLOL SUCCINATE ER 100 MG PO TB24: 100.0000 mg | ORAL_TABLET | Freq: Every day | ORAL | 0 refills | Status: DC

## 2019-10-26 MED ORDER — AMOXICILLIN-POT CLAVULANATE 875-125 MG PO TABS: 1.0000 | ORAL_TABLET | Freq: Two times a day (BID) | ORAL | 0 refills | Status: AC

## 2019-10-26 NOTE — TOC Transition Note (Signed)
Transition of Care [facility]) - CM/SW Discharge Note   Patient Details  Name: KENDREW PACI MRN: [id number] Date of Birth: 1960/04/15  [facility] CM/SW Contact:  Kermit Balo, RN Phone Number: 10/26/2019, 12:51 PM   Clinical Narrative:    Pt discharging home with outpatient therapy follow up. Pt selected to attend [facility]. Orders in Epic and information on the AVS.  Pt has PCP and they use [facility] to assist with the cost of medications.  Wife to provide transport home.    Final next level of care: OP Rehab Barriers to Discharge: Inadequate or no insurance, Barriers Unresolved (comment)   Patient Goals and CMS Choice     Choice offered to / list presented to : Patient, Spouse  Discharge Placement                       Discharge Plan and Services                                     Social Determinants of Health (SDOH) Interventions     Readmission Risk Interventions No flowsheet data found.

## 2019-10-26 NOTE — Plan of Care (Signed)

## 2019-10-26 NOTE — Progress Notes (Signed)
Discharge instructions given to patient. IV removed, clean and intact. Medications reviewed, all questions answered. Pt escorted home with wife.  Waylynn Benefiel R Emaline Karnes, RN  

## 2019-10-26 NOTE — Progress Notes (Signed)
Physical Therapy Treatment Patient Details Name: David Gill MRN: [id number] DOB: 12-03-1960 Today's Date: 10/26/2019    History of Present Illness David Gill is a 59 y.o. male with medical history significant of essential hypertension and remote history of tobacco use presents with complaints of elevated heart rates and anxiousness over the last 2 months.  Symptoms started after he was hospitalized in June at [facility] for a pneumonia.   Symptoms  usually occur while he is sitting up or standing and reports that he has had more episodes in the evening around 5-6 PM.  When he tries to get up it feels like he is on a boat although the room is not spinning around him.     PT Comments    Pt admitted with above diagnosis. Pt treated once more with Epley maneuver right BPPV.  Pt understands home exercise program and is ready to go home.  Pt currently with functional limitations due to balance deficits and dizziness.  Pt will benefit from skilled PT to increase their independence and safety with mobility to allow discharge to the venue listed below.     Follow Up Recommendations  Outpatient PT;Supervision - Intermittent (Vestibular rehab)     Equipment Recommendations  None recommended by PT    Recommendations for Other Services       Precautions / Restrictions Precautions Precautions: Fall Restrictions Weight Bearing Restrictions: No    Mobility  Bed Mobility Overal bed mobility: Independent                Transfers Overall transfer level: Needs assistance Equipment used: None Transfers: Sit to/from Stand Sit to Stand: Supervision            Ambulation/Gait                 Stairs             Wheelchair Mobility    Modified Rankin (Stroke Patients Only)       Balance Overall balance assessment: Needs assistance Sitting-balance support: No upper extremity supported;Feet supported Sitting balance-Leahy Scale: Fair      Standing balance support: No upper extremity supported;During functional activity Standing balance-Leahy Scale: Fair Standing balance comment: can stand statically without UE support and dynamically with min guard assist.                             Cognition Arousal/Alertness: Awake/alert Behavior During Therapy: WFL for tasks assessed/performed Overall Cognitive Status: Within Functional Limits for tasks assessed                                        Exercises Other Exercises Other Exercises:  Medbridge handout [id number] to pt.     General Comments General comments (skin integrity, edema, etc.): Rechecked for right BPPV and pt still with possible trace of crystals therefore repositioned once more for right BPPV.  Pt aware of how and when to perform exercises once home.        Pertinent Vitals/Pain Pain Assessment: Faces Faces Pain Scale: Hurts even more Pain Location: left hip and knee Pain Descriptors / Indicators: Discomfort;Burning Pain Intervention(s): Limited activity within patient's tolerance;Monitored during session;Repositioned    Home Living                      Prior Function  PT Goals (current goals can now be found in the care plan section) Acute Rehab PT Goals Patient Stated Goal: to go home Progress towards PT goals: Progressing toward goals    Frequency    Min 3X/week      PT Plan Current plan remains appropriate    Co-evaluation              AM-PAC PT "6 Clicks" Mobility   Outcome Measure  Help needed turning from your back to your side while in a flat bed without using bedrails?: None Help needed moving from lying on your back to sitting on the side of a flat bed without using bedrails?: None Help needed moving to and from a bed to a chair (including a wheelchair)?: None Help needed standing up from a chair using your arms (e.g., wheelchair or bedside chair)?: A Little Help needed to  walk in hospital room?: A Little Help needed climbing 3-5 steps with a railing? : A Little 6 Click Score: 21    End of Session Equipment Utilized During Treatment: Gait belt Activity Tolerance: Patient tolerated treatment well Patient left: in chair;with call bell/phone within reach;with chair alarm set Nurse Communication: Mobility status PT Visit Diagnosis: Dizziness and giddiness (R42);BPPV;Muscle weakness (generalized) (M62.81) BPPV - Right/Left : Right     Time: 1050-1104 PT Time Calculation (min) (ACUTE ONLY): 14 min  Charges:  $Canalith Rep Proc: 8-22 mins                     Marcie Shearon W,PT Acute Rehabilitation Services Pager:  [phone]  Office:  [phone]     Berline Lopes 10/26/2019, 11:20 AM

## 2019-10-26 NOTE — Discharge Summary (Signed)
Physician Discharge Summary  David Gill ZOX:[id number] DOB: [birth year] DOA: 10/22/2019  PCP: Olive Bass, MD  Admit date: 10/22/2019 Discharge date: 10/26/2019  Admitted From: home Disposition:  home  Recommendations for Outpatient Follow-up:  1. Follow up with PCP in 1-2 weeks 2. Please obtain BMP/CBC in one week 3. Please follow up on the following pending results:  Home Health:no  Equipment/Devices: none  Discharge Condition: Stable Code Status:   Code Status: Full Code Diet recommendation:  Diet Order            Diet - low sodium heart healthy           Diet Heart Room service appropriate? Yes; Fluid consistency: Thin  Diet effective now                  Brief/Interim Summary: 59 y.o.malewith medical history significant ofessential hypertensionandremote history of tobacco usepresents with complaints of elevated heart rates and anxiousness over the last 2 months. Symptoms started after he was hospitalized in June at [facility] for a pneumonia. Symptoms usually occur while he is sitting up or standing and reports that he has had more episodes in the evening around 5-6PM. When he tries to get up it feels like he is on a boat although the room is not spinning around him. He reports difficulty walking, but also notes that he has pain in his left knee. He has been evaluated by his primary care provider for the symptoms and he was switched from losartan to metoprolol 50 mg. At one point they tried increasing to metoprolol 100 mg daily, but the patient did not notice any change in his symptoms. Notes associated symptoms of intermittent abdominal distention with cramping, change in stool color, lower back pain,sinus congestion, sinus pain, and right lower jaw pain that he suspects may be related to a tooth infection. He had recently been started on Augmentin to treat for the possibility of a sinus infection without any change in symptoms. CT angiogram  of the chest, abdomen, and pelvis did not show any signs of PE. Imaging did note a mosaic pattern bilaterally concerning for small airway disease and marked right hydronephrosis secondary to what appeared to be chronic UPJ junction obstruction. Patient was admitted, treated for source no clear source of infection, blood culture has been negative.  He had episode of sinus tachycardia dizziness anxiety headache vertigo, vertigo did get better with Epley's maneuver and PT evaluation none has been ambulating well, PT has cleared him for discharge home with outpatient intermittent supervision.  He had 24 hr urine metanephrines collected to rule out pheochromocytoma, low suspicion and he will follow up with PCP for the results. He is feeling much improved no nausea vomiting chest pain shortness of breath.  Ambulating well.  Agreeable for discharge home today. He will complete the course of antibiotics given his known infection. Blood pressure stabilized after being on HCTZ, home metoprolol, EF normal on echo. He had anxiety issues and will be going home on hydroxyzine as needed Also found to have new GI obstruction chronic with normal renal function and he will follow-up with urology, I have provided [facility] with [facility]   Discharge Diagnoses:  SIRS: No self infection.  Blood culture negative.   Essential hypertension: Continue meds as above Hypokalemia: Resolved Metabolic acidosis with increased anion gap and accumulation of organic acids Vertigo/dizziness Anxiety Osteoarthritis UPJ obstruction CT chest with small airway disease outpatient follow-up with PCP for symptomatic Dental caries.  He  is aware that he needs to follow-up with dentist soon Abdominal bloating, he reports he is due for his colonoscopy and advised to continue PPI bid x1 wk and if he does not get better he needs to see GI  Will need close follow-up with PCP to monitor blood pressure and also on the labs, instructed to  follow-up this Friday or at least by Monday  Subjective: Alert awake oriented, no new complaints.  Ambulating well.  Vertigo dizziness significantly improved.  Discharge Exam: Vitals:   10/26/19 0752 10/26/19 0911  BP: (!) 155/103   Pulse: 75 90  Resp: 16   Temp: (!) 97.5 F (36.4 C)   SpO2: 96%    General: Pt is alert, awake, not in acute distress Cardiovascular: RRR, S1/S2 +, no rubs, no gallops Respiratory: CTA bilaterally, no wheezing, no rhonchi Abdominal: Soft, NT, ND, bowel sounds + Extremities: no edema, no cyanosis  Discharge Instructions  Discharge Instructions    Diet - low sodium heart healthy   Complete by: As directed    Discharge instructions   Complete by: As directed    Please follow-up with urology, PCP. Please monitor blood pressure at home if heart rate less than 60/min you may need to cut down your metoprolol dose and if blood pressure is poorly controlled > 160s systolic or less than 100 systolic  your medicine dose needs to be adjusted.    Please call call MD or return to ER for similar or worsening recurring problem that brought you to hospital or if any fever,nausea/vomiting,abdominal pain, uncontrolled pain, chest pain,  shortness of breath or any other alarming symptoms.  Please follow-up your doctor as instructed in a week time and call the office for appointment.  Please avoid alcohol, smoking, or any other illicit substance and maintain healthy habits including taking your regular medications as prescribed.  You were cared for by a hospitalist during your hospital stay. If you have any questions about your discharge medications or the care you received while you were in the hospital after you are discharged, you can call the unit and ask to speak with the hospitalist on call if the hospitalist that took care of you is not available.  Once you are discharged, your primary care physician will handle any further medical issues. Please note that NO  REFILLS for any discharge medications will be authorized once you are discharged, as it is imperative that you return to your primary care physician (or establish a relationship with a primary care physician if you do not have one) for your aftercare needs so that they can reassess your need for medications and monitor your lab values   Increase activity slowly   Complete by: As directed      Allergies as of 10/26/2019      Reactions   Sulfamethoxazole Hives   Triamcinolone Acetonide Hives      Medication List    STOP taking these medications   losartan-hydrochlorothiazide 100-12.5 MG tablet Commonly known as: HYZAAR     TAKE these medications   acetaminophen 500 MG tablet Commonly known as: TYLENOL Take 500 mg by mouth every 6 (six) hours as needed for moderate pain.   amoxicillin-clavulanate 875-125 MG tablet Commonly known as: AUGMENTIN Take 1 tablet by mouth 2 (two) times daily for 3 days.   cetirizine 10 MG tablet Commonly known as: ZYRTEC Take 10 mg by mouth daily.   fluticasone 50 MCG/ACT nasal spray Commonly known as: FLONASE Place 1 spray into both  nostrils in the morning and at bedtime.   hydrochlorothiazide 25 MG tablet Commonly known as: HYDRODIURIL Take 1 tablet (25 mg total) by mouth daily. Start taking on: October 27, 2019   hydrOXYzine 25 MG tablet Commonly known as: ATARAX/VISTARIL Take 1 tablet (25 mg total) by mouth 3 (three) times daily as needed for up to 30 doses for anxiety.   meclizine 25 MG tablet Commonly known as: ANTIVERT Take 25 mg by mouth 3 (three) times daily as needed for dizziness.   metoprolol succinate 100 MG 24 hr tablet Commonly known as: TOPROL-XL Take 1 tablet (100 mg total) by mouth daily. Take with or immediately following a meal. Start taking on: October 27, 2019 What changed:   medication strength  how much to take   zolpidem 12.5 MG CR tablet Commonly known as: AMBIEN CR Take 12.5 mg by mouth daily.        Follow-up Information    Dough, Doris Cheadle, MD Follow up in 4 day(s).   Specialty: Family Medicine Why: call office  Contact information: [address] [place] [address] [phone]        [facility] Follow up in 1 week(s).   Why: For UPJ obstruction noted in the CT scan Contact information: [address] [place] [address] [place] [address] [phone]             Allergies  Allergen Reactions  . Sulfamethoxazole Hives  . Triamcinolone Acetonide Hives    The results of significant diagnostics from this hospitalization (including imaging, microbiology, ancillary and laboratory) are listed below for reference.    Microbiology: Recent Results (from the past 240 hour(s))  SARS Coronavirus 2 by RT PCR (hospital order, performed in [facility] hospital lab) Nasopharyngeal Nasopharyngeal Swab     Status: None   Collection Time: 10/23/19  1:45 AM   Specimen: Nasopharyngeal Swab  Result Value Ref Range Status   SARS Coronavirus 2 NEGATIVE NEGATIVE Final    Comment: (NOTE) SARS-CoV-2 target nucleic acids are NOT DETECTED.  The SARS-CoV-2 RNA is generally detectable in upper and lower respiratory specimens during the acute phase of infection. The lowest concentration of SARS-CoV-2 viral copies this assay can detect is 250 copies / mL. A negative result does not preclude SARS-CoV-2 infection and should not be used as the sole basis for treatment or other patient management decisions.  A negative result may occur with improper specimen collection / handling, submission of specimen other than nasopharyngeal swab, presence of viral mutation(s) within the areas targeted by this assay, and inadequate number of viral copies (<250 copies / mL). A negative result must be combined with clinical observations, patient history, and epidemiological information.  Fact Sheet for Patients:   BoilerBrush.com.cy  Fact Sheet for Healthcare  Providers: https://pope.com/  This test is not yet approved or  cleared by the [place] FDA and has been authorized for detection and/or diagnosis of SARS-CoV-2 by FDA under an Emergency Use Authorization (EUA).  This EUA will remain in effect (meaning this test can be used) for the duration of the COVID-19 declaration under Section 564(b)(1) of the Act, 21 U.S.C. section 360bbb-3(b)(1), unless the authorization is terminated or revoked sooner.  Performed at [facility], [address], [place], [place] [address]   Culture, blood (routine x 2)     Status: None (Preliminary result)   Collection Time: 10/23/19 11:50 AM   Specimen: BLOOD  Result Value Ref Range Status   Specimen Description BLOOD  SITE NOT SPECIFIED  Final   Special Requests   Final    BOTTLES DRAWN AEROBIC AND ANAEROBIC Blood Culture adequate volume   Culture   Final    NO GROWTH 2 DAYS Performed at [facility], [address] [place], [place] [address]    Report Status PENDING  Incomplete  Culture, blood (routine x 2)     Status: None (Preliminary result)   Collection Time: 10/23/19 11:54 AM   Specimen: BLOOD  Result Value Ref Range Status   Specimen Description BLOOD SITE NOT SPECIFIED  Final   Special Requests   Final    BOTTLES DRAWN AEROBIC AND ANAEROBIC Blood Culture results may not be optimal due to an excessive volume of blood received in culture bottles   Culture   Final    NO GROWTH 2 DAYS Performed at [facility], [address] [place], [place] [address]    Report Status PENDING  Incomplete  MRSA PCR Screening     Status: None   Collection Time: 10/23/19  7:03 PM   Specimen: Nasopharyngeal  Result Value Ref Range Status   MRSA by PCR NEGATIVE NEGATIVE Final    Comment:        The GeneXpert MRSA Assay (FDA approved for NASAL specimens only), is one component of a comprehensive MRSA colonization surveillance program. It is not intended to  diagnose MRSA infection nor to guide or monitor treatment for MRSA infections. Performed at [facility], [address], [place], [place] [address]     Procedures/Studies: [place] Orthopant[facility]  Result Date: 10/23/2019 CLINICAL DATA:  Dental infection EXAM: ORTHOPANTOGRAM/PANORAMIC COMPARISON:  None. FINDINGS: Panorex view of the mandible demonstrates erosive changes of the right upper molar tooth and left lower premolar and molar. No bony abnormalities. IMPRESSION: 1. Erosive changes of the right upper molar and left lower premolar and molar. Electronically Signed   By: Sharlet Salina M.D.   On: 10/23/2019 19:05   CT ANGIO CHEST PE W OR WO CONTRAST  Result Date: 10/23/2019 CLINICAL DATA:  Abdominal pain, acute. Near syncope, dizziness and palpitations. EXAM: CT ANGIOGRAPHY CHEST CT ABDOMEN AND PELVIS WITH CONTRAST TECHNIQUE: Multidetector CT imaging of the chest was performed using the standard protocol during bolus administration of intravenous contrast. Multiplanar CT image reconstructions and MIPs were obtained to evaluate the vascular anatomy. Multidetector CT imaging of the abdomen and pelvis was performed using the standard protocol during bolus administration of intravenous contrast. CONTRAST:  39mL OMNIPAQUE IOHEXOL 350 MG/ML SOLN COMPARISON:  CT angio chest 08/09/2019 FINDINGS: CTA CHEST FINDINGS Cardiovascular: The main pulmonary artery appears patent. No saddle embolus. No lobar or segmental pulmonary artery filling defects identified. Normal heart size. Mediastinum/Nodes: No enlarged mediastinal, hilar, or axillary lymph nodes. Thyroid gland, trachea, and esophagus demonstrate no significant findings. Lungs/Pleura: No pleural effusion identified. Mild mosaic attenuation pattern is identified bilaterally. No airspace consolidation identified. Musculoskeletal: No acute or suspicious osseous findings. Review of the MIP images confirms the above findings. CT ABDOMEN and PELVIS FINDINGS  Hepatobiliary: Hepatic steatosis. No focal liver abnormality identified. Gallbladder is unremarkable. No bile duct dilatation. Pancreas: Unremarkable. No pancreatic ductal dilatation or surrounding inflammatory changes. Spleen: Normal in size without focal abnormality. Adrenals/Urinary Tract: Normal appearance of the adrenal glands. Prominent left kidney extrarenal pelvis noted. No left kidney hydronephrosis or mass identified. Mild left hydroureter is identified. No left ureteral lithiasis or mass identified. There are signs of marked right hydronephrosis to the level of the UPJ with  significant atrophy of normal renal parenchyma. Normal caliber right ureter. Urinary bladder appears unremarkable. Stomach/Bowel: The stomach appears nondistended. The appendix is visualized and appears normal. There is no bowel wall thickening, inflammation or distension. Vascular/Lymphatic: Normal appearance of the abdominal aorta. No aneurysm. No abdominopelvic adenopathy identified. Reproductive: Prostate is unremarkable. Other: No free fluid or fluid collections. Musculoskeletal: No acute or significant osseous findings. Lumbar degenerative disc disease. Review of the MIP images confirms the above findings. IMPRESSION: 1. No evidence for acute pulmonary embolus. 2. Mild mosaic attenuation pattern identified bilaterally which may reflect small airways disease. 3. Marked right hydronephrosis to the level of the UPJ with significant atrophy of normal renal parenchyma. 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 4. Hepatic steatosis. Electronically Signed   By: Signa Kell M.D.   On: 10/23/2019 05:39   CT ABDOMEN PELVIS W CONTRAST  Result Date: 10/23/2019 CLINICAL DATA:  Abdominal pain, acute. Near syncope, dizziness and palpitations. EXAM: CT ANGIOGRAPHY CHEST CT ABDOMEN AND PELVIS WITH CONTRAST TECHNIQUE: Multidetector CT imaging of the chest was performed using  the standard protocol during bolus administration of intravenous contrast. Multiplanar CT image reconstructions and MIPs were obtained to evaluate the vascular anatomy. Multidetector CT imaging of the abdomen and pelvis was performed using the standard protocol during bolus administration of intravenous contrast. CONTRAST:  57mL OMNIPAQUE IOHEXOL 350 MG/ML SOLN COMPARISON:  CT angio chest 08/09/2019 FINDINGS: CTA CHEST FINDINGS Cardiovascular: The main pulmonary artery appears patent. No saddle embolus. No lobar or segmental pulmonary artery filling defects identified. Normal heart size. Mediastinum/Nodes: No enlarged mediastinal, hilar, or axillary lymph nodes. Thyroid gland, trachea, and esophagus demonstrate no significant findings. Lungs/Pleura: No pleural effusion identified. Mild mosaic attenuation pattern is identified bilaterally. No airspace consolidation identified. Musculoskeletal: No acute or suspicious osseous findings. Review of the MIP images confirms the above findings. CT ABDOMEN and PELVIS FINDINGS Hepatobiliary: Hepatic steatosis. No focal liver abnormality identified. Gallbladder is unremarkable. No bile duct dilatation. Pancreas: Unremarkable. No pancreatic ductal dilatation or surrounding inflammatory changes. Spleen: Normal in size without focal abnormality. Adrenals/Urinary Tract: Normal appearance of the adrenal glands. Prominent left kidney extrarenal pelvis noted. No left kidney hydronephrosis or mass identified. Mild left hydroureter is identified. No left ureteral lithiasis or mass identified. There are signs of marked right hydronephrosis to the level of the UPJ with significant atrophy of normal renal parenchyma. Normal caliber right ureter. Urinary bladder appears unremarkable. Stomach/Bowel: The stomach appears nondistended. The appendix is visualized and appears normal. There is no bowel wall thickening, inflammation or distension. Vascular/Lymphatic: Normal appearance of the  abdominal aorta. No aneurysm. No abdominopelvic adenopathy identified. Reproductive: Prostate is unremarkable. Other: No free fluid or fluid collections. Musculoskeletal: No acute or significant osseous findings. Lumbar degenerative disc disease. Review of the MIP images confirms the above findings. IMPRESSION: 1. No evidence for acute pulmonary embolus. 2. Mild mosaic attenuation pattern identified bilaterally which may reflect small airways disease. 3. Marked right hydronephrosis to the level of the UPJ with significant atrophy of normal renal parenchyma. 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 4. Hepatic steatosis. Electronically Signed   By: Signa Kell M.D.   On: 10/23/2019 05:39   DG Chest Port 1 View  Result Date: 10/23/2019 CLINICAL DATA:  Dizziness, palpitations, near syncope EXAM: PORTABLE CHEST 1 VIEW COMPARISON:  08/09/2019 FINDINGS: Two frontal views of the chest demonstrate an unremarkable cardiac silhouette. No airspace  disease, effusion, or pneumothorax. No acute bony abnormalities. IMPRESSION: 1. No acute intrathoracic process. Electronically Signed   By: Sharlet Salina M.D.   On: 10/23/2019 02:45   ECHOCARDIOGRAM COMPLETE  Result Date: 10/23/2019    ECHOCARDIOGRAM REPORT   Patient Name:   LAVOY BERNARDS Date of Exam: 10/23/2019 Medical Rec #:  [id number]             Height: Accession #:    [id number]            Weight: Date of Birth:  [birth year]             BSA: Patient Age:    59 years              BP:           147/84 mmHg Patient Gender: M                     HR:           121 bpm. Exam Location:  Inpatient Procedure: 2D Echo, Cardiac Doppler and Color Doppler Indications:    Palpitations  History:        Patient has no prior history of Echocardiogram examinations.                 Tachycardia.  Sonographer:    Ross Ludwig RDCS (AE) Referring Phys: [id number] [facility] A SMITH  Sonographer Comments: No subcostal window.  IMPRESSIONS  1. Left ventricular ejection fraction, by estimation, is 65 to 70%. The left ventricle has normal function. The left ventricle has no regional wall motion abnormalities. There is moderate left ventricular hypertrophy. Left ventricular diastolic parameters are consistent with Grade I diastolic dysfunction (impaired relaxation).  2. Right ventricular systolic function is normal. The right ventricular size is normal.  3. The mitral valve is normal in structure. No evidence of mitral valve regurgitation. No evidence of mitral stenosis.  4. The aortic valve is normal in structure. Aortic valve regurgitation is not visualized. No aortic stenosis is present.  5. The inferior vena cava is normal in size with greater than 50% respiratory variability, suggesting right atrial pressure of 3 mmHg. FINDINGS  Left Ventricle: Left ventricular ejection fraction, by estimation, is 65 to 70%. The left ventricle has normal function. The left ventricle has no regional wall motion abnormalities. The left ventricular internal cavity size was normal in size. There is  moderate left ventricular hypertrophy. Left ventricular diastolic parameters are consistent with Grade I diastolic dysfunction (impaired relaxation). Right Ventricle: The right ventricular size is normal. No increase in right ventricular wall thickness. Right ventricular systolic function is normal. Left Atrium: Left atrial size was normal in size. Right Atrium: Right atrial size was normal in size. Pericardium: There is no evidence of pericardial effusion. Mitral Valve: The mitral valve is normal in structure. No evidence of mitral valve regurgitation. No evidence of mitral valve stenosis. Tricuspid Valve: The tricuspid valve is normal in structure. Tricuspid valve regurgitation is not demonstrated. No evidence of tricuspid stenosis. Aortic Valve: The aortic valve is normal in structure. Aortic valve regurgitation is not visualized. No aortic stenosis is  present. Aortic valve mean gradient measures 3.0 mmHg. Aortic valve peak gradient measures 6.2 mmHg. Aortic valve area, by VTI measures 2.77 cm. Pulmonic Valve: The pulmonic valve was normal in structure. Pulmonic valve regurgitation is trivial. No evidence of pulmonic stenosis. Aorta: The aortic root is normal in size and structure. Venous: The inferior vena cava is normal  in size with greater than 50% respiratory variability, suggesting right atrial pressure of 3 mmHg. IAS/Shunts: No atrial level shunt detected by color flow Doppler.  LEFT VENTRICLE PLAX 2D LVIDd:         2.80 cm LVIDs:         1.90 cm LV PW:         1.80 cm LV IVS:        1.70 cm LVOT diam:     2.00 cm LV SV:         59 LVOT Area:     3.14 cm  RIGHT VENTRICLE RV Basal diam:  2.30 cm RV S prime:     18.35 cm/s TAPSE (M-mode): 2.5 cm LEFT ATRIUM             RIGHT ATRIUM LA diam:        3.00 cm RA Area:     12.10 cm LA Vol (A2C):   37.4 ml RA Volume:   26.80 ml LA Vol (A4C):   32.0 ml LA Biplane Vol: 35.2 ml  AORTIC VALVE AV Area (Vmax):    2.94 cm AV Area (Vmean):   2.81 cm AV Area (VTI):     2.77 cm AV Vmax:           125.00 cm/s AV Vmean:          87.300 cm/s AV VTI:            0.212 m AV Peak Grad:      6.2 mmHg AV Mean Grad:      3.0 mmHg LVOT Vmax:         117.00 cm/s LVOT Vmean:        78.100 cm/s LVOT VTI:          0.187 m LVOT/AV VTI ratio: 0.88  AORTA Ao Root diam: 3.10 cm Ao Asc diam:  3.00 cm  SHUNTS Systemic VTI:  0.19 m Systemic Diam: 2.00 cm Donato Schultz MD Electronically signed by Donato Schultz MD Signature Date/Time: 10/23/2019/2:03:14 PM    Final    CT MAXILLOFACIAL WO CONTRAST  Result Date: 10/24/2019 CLINICAL DATA:  59 year old male is off balance, tachycardia, sinus pain, right lower jaw pain recently started on Augmentin for possible tooth infection. EXAM: CT MAXILLOFACIAL WITHOUT CONTRAST TECHNIQUE: Multidetector CT imaging of the maxillofacial structures was performed. Multiplanar CT image reconstructions were also  generated. COMPARISON:  Report of [facility] brain MRI 10/30/2011 (no images available). FINDINGS: Osseous: Carious residual left mandible bicuspid and molar. Carious contralateral right maxillary anterior molar, and the posterior right mandible molar demonstrates abnormal periapical lucency (series 5, image 33). But no definite overlying soft tissue inflammation is identified. Mandible is normally located. No facial fracture identified. Visible calvarium, central skull base, and cervical vertebrae appear intact. Orbits: Intact orbital walls. Symmetric and negative noncontrast orbits soft tissues aside from Disconjugate gaze. Sinuses: Paranasal sinuses are clear throughout. Unremarkable nasal cavity. Tympanic cavities and mastoids are clear. Soft tissues: Negative visible noncontrast thyroid, larynx, pharynx, parapharyngeal spaces, retropharyngeal space, sublingual space, submandibular spaces, masticator spaces, and parotid spaces. No upper cervical lymphadenopathy. Limited intracranial: Negative visible noncontrast brain parenchyma. IMPRESSION: 1. Carious posterior dentition, including posterior right mandible molar with abnormal periapical lucency, but no associated soft tissue inflammation identified. 2. Otherwise negative noncontrast Face CT. Sinuses and middle ears are clear. Electronically Signed   By: Odessa Fleming M.D.   On: 10/24/2019 15:51    Labs: BNP (last  3 results) No results for input(s): BNP in the last 8760 hours. Basic Metabolic Panel: Recent Labs  Lab 10/23/19 0006 10/23/19 1150 10/24/19 0555 10/25/19 0124  NA 137  --  138 140  K 3.4*  --  3.7 3.4*  CL 102  --  106 107  CO2 19*  --  22 22  GLUCOSE 156*  --  98 127*  BUN 13  --  13 9  CREATININE 1.24  --  1.13 1.10  CALCIUM 10.0  --  8.9 8.7*  MG  --  1.4*  --  2.5*   Liver Function Tests: Recent Labs  Lab 10/23/19 0006  AST 35  ALT 63*  ALKPHOS 71  BILITOT 1.0  PROT 7.7  ALBUMIN  4.4   Recent Labs  Lab 10/23/19 0006  LIPASE 38   No results for input(s): AMMONIA in the last 168 hours. CBC: Recent Labs  Lab 10/23/19 0006 10/24/19 0555 10/25/19 0124  WBC 16.4* 11.6* 12.2*  NEUTROABS  --   --  7.3  HGB 17.9* 14.4 14.8  HCT 50.5 41.1 42.0  MCV 85.7 89.9 87.5  PLT 350 280 296   Cardiac Enzymes: No results for input(s): CKTOTAL, CKMB, CKMBINDEX, TROPONINI in the last 168 hours. BNP: Invalid input(s): POCBNP CBG: No results for input(s): GLUCAP in the last 168 hours. D-Dimer No results for input(s): DDIMER in the last 72 hours. Hgb A1c No results for input(s): HGBA1C in the last 72 hours. Lipid Profile No results for input(s): CHOL, HDL, LDLCALC, TRIG, CHOLHDL, LDLDIRECT in the last 72 hours. Thyroid function studies No results for input(s): TSH, T4TOTAL, T3FREE, THYROIDAB in the last 72 hours.  Invalid input(s): FREET3 Anemia work up No results for input(s): VITAMINB12, FOLATE, FERRITIN, TIBC, IRON, RETICCTPCT in the last 72 hours. Urinalysis    Component Value Date/Time   COLORURINE YELLOW 10/23/2019 0153   APPEARANCEUR CLEAR 10/23/2019 0153   LABSPEC 1.009 10/23/2019 0153   PHURINE 7.0 10/23/2019 0153   GLUCOSEU NEGATIVE 10/23/2019 0153   HGBUR NEGATIVE 10/23/2019 0153   BILIRUBINUR NEGATIVE 10/23/2019 0153   KETONESUR NEGATIVE 10/23/2019 0153   PROTEINUR 100 (A) 10/23/2019 0153   NITRITE NEGATIVE 10/23/2019 0153   LEUKOCYTESUR NEGATIVE 10/23/2019 0153   Sepsis Labs Invalid input(s): PROCALCITONIN,  WBC,  LACTICIDVEN Microbiology Recent Results (from the past 240 hour(s))  SARS Coronavirus 2 by RT PCR (hospital order, [facility]) Nasopharyngeal Nasopharyngeal Swab     Status: None   Collection Time: 10/23/19  1:45 AM   Specimen: Nasopharyngeal Swab  Result Value Ref Range Status   SARS Coronavirus 2 NEGATIVE NEGATIVE Final    Comment: (NOTE) SARS-CoV-2 target nucleic acids are NOT DETECTED.  The  SARS-CoV-2 RNA is generally detectable in upper and lower respiratory specimens during the acute phase of infection. The lowest concentration of SARS-CoV-2 viral copies this assay can detect is 250 copies / mL. A negative result does not preclude SARS-CoV-2 infection and should not be used as the sole basis for treatment or other patient management decisions.  A negative result may occur with improper specimen collection / handling, submission of specimen other than nasopharyngeal swab, presence of viral mutation(s) within the areas targeted by this assay, and inadequate number of viral copies (<250 copies / mL). A negative result must be combined with clinical observations, patient history, and epidemiological information.  Fact Sheet for Patients:   BoilerBrush.com.cyhttps://www.fda.gov/media/136312/download  Fact Sheet for Healthcare Providers: https://pope.com/https://www.fda.gov/media/136313/download  This test is not yet  approved or  cleared by the [place] and has been authorized for detection and/or diagnosis of SARS-CoV-2 by FDA under an Emergency Use Authorization (EUA).  This EUA will remain in effect (meaning this test can be used) for the duration of the COVID-19 declaration under Section 564(b)(1) of the Act, 21 U.S.C. section 360bbb-3(b)(1), unless the authorization is terminated or revoked sooner.  Performed at [facility], [address] [place], [place] [address]   Culture, blood (routine x 2)     Status: None (Preliminary result)   Collection Time: 10/23/19 11:50 AM   Specimen: BLOOD  Result Value Ref Range Status   Specimen Description BLOOD SITE NOT SPECIFIED  Final   Special Requests   Final    BOTTLES DRAWN AEROBIC AND ANAEROBIC Blood Culture adequate volume   Culture   Final    NO GROWTH 2 DAYS Performed at [facility], [address], [place], [place] [address]    Report Status PENDING  Incomplete  Culture, blood (routine x 2)     Status: None (Preliminary  result)   Collection Time: 10/23/19 11:54 AM   Specimen: BLOOD  Result Value Ref Range Status   Specimen Description BLOOD SITE NOT SPECIFIED  Final   Special Requests   Final    BOTTLES DRAWN AEROBIC AND ANAEROBIC Blood Culture results may not be optimal due to an excessive volume of blood received in culture bottles   Culture   Final    NO GROWTH 2 DAYS Performed at [facility], [address], [place], [place] [address]    Report Status PENDING  Incomplete  MRSA PCR Screening     Status: None   Collection Time: 10/23/19  7:03 PM   Specimen: Nasopharyngeal  Result Value Ref Range Status   MRSA by PCR NEGATIVE NEGATIVE Final    Comment:        The GeneXpert MRSA Assay (FDA approved for NASAL specimens only), is one component of a comprehensive MRSA colonization surveillance program. It is not intended to diagnose MRSA infection nor to guide or monitor treatment for MRSA infections. Performed at [facility], [address], [place], [place] [address]      Time coordinating discharge: 25 minutes  SIGNED: Lanae Boast, MD  [facility] 10/26/2019, 11:34 AM  If 7PM-7AM, please contact night-coverage [email or web address]

## 2019-10-28 LAB — CULTURE, BLOOD (ROUTINE X 2)
Culture: NO GROWTH
Culture: NO GROWTH
Special Requests: ADEQUATE

## 2019-10-29 LAB — METANEPHRINES, URINE, 24 HOUR
Metaneph Total, Ur: 44 ug/L
Metanephrines, 24H Ur: 111 ug/24 hr (ref 58–276)
Normetanephrine, 24H Ur: 360 ug/24 hr (ref 156–729)
Normetanephrine, Ur: 143 ug/L
Total Volume: 2520

## 2019-11-23 DIAGNOSIS — R1084 Generalized abdominal pain: Secondary | ICD-10-CM | POA: Insufficient documentation

## 2019-11-23 HISTORY — DX: Generalized abdominal pain: R10.84

## 2020-02-10 DIAGNOSIS — Z6832 Body mass index (BMI) 32.0-32.9, adult: Secondary | ICD-10-CM

## 2020-02-10 HISTORY — DX: Body mass index (BMI) 32.0-32.9, adult: Z68.32

## 2020-02-24 ENCOUNTER — Encounter: Payer: Self-pay | Admitting: Neurology

## 2020-03-14 NOTE — Progress Notes (Unsigned)
NEUROLOGY CONSULTATION NOTE  David Gill MRN: [id number] DOB: 20-May-1960  Referring provider: Marikay Alar, MD Primary care provider: Dina Rich, MD  Reason for consult:  Unsteady gait   Subjective:  David Gill is a 60 year old left-handed male with HTN who presents for unsteady gait.  History supplemented by referring provider's note.  He is accompanied by his wife.    Over the past year, he had multiple issues.  He had pneumonia in June 2021.  He then developed dizziness with palpitations and fluctuating blood pressure.  He was started on metoprolol and miratazapine.  He stopped the mirtazapine after a month due to side effects such as nightmares.  He was treated with Augmentin for possible tooth infection as he endorsed jaw pain.  CT sinuses showed carious posterior dentition including posterior right mandible molar with abnormal periapical lucency but no associated inflammation.  He was admitted to the hospital in September 2021 for his ongoing symptoms such as tachycardia, dizziness and anxiety.  TSH was 1.305.  He also had been experiencing abdominal pain.  He endorsed low back pain radiating into the left hip pain.  MRI of lumbar spine from October 2021 showed spondylosis most significant at L3-4 causing moderate bilateral neural foraminal narrowing as well as L4-5 and L5-S1 mild neural foraminal narrowing.  An MRI of the brain and cervical spine have been ordered but only the brain was approved.  He has not scheduled it yet.  The MRI of cervical spine was denied.  He says the dizziness isn't a spinning, lightheadedness or wooziness.  It occurs when standing and states he feels like he is standing on a boat, an undulating sensation.  No headache, nausea, new visual deficits.  He has had some intermittent neck pain and numbness in his arms.  No weakness.  It is persistent when standing.  No falls.  He had blood work performed last week which reportedly showed a Hgb A1c  of 11 with serum glucose 500.  He also had elevated cholesterol and his triglycerides were "off the charts".  He was born with retinal abnormalities with   PAST MEDICAL HISTORY: Past Medical History:  Diagnosis Date  . Back pain   . History of tobacco abuse   . Hypertension     PAST SURGICAL HISTORY: No past surgical history on file.  MEDICATIONS: Current Outpatient Medications on File Prior to Visit  Medication Sig Dispense Refill  . acetaminophen (TYLENOL) 500 MG tablet Take 500 mg by mouth every 6 (six) hours as needed for moderate pain.    . cetirizine (ZYRTEC) 10 MG tablet Take 10 mg by mouth daily.    . fluticasone (FLONASE) 50 MCG/ACT nasal spray Place 1 spray into both nostrils in the morning and at bedtime.    . hydrochlorothiazide (HYDRODIURIL) 25 MG tablet Take 1 tablet (25 mg total) by mouth daily. 30 tablet 0  . hydrOXYzine (ATARAX/VISTARIL) 25 MG tablet Take 1 tablet (25 mg total) by mouth 3 (three) times daily as needed for up to 30 doses for anxiety. 30 tablet 0  . meclizine (ANTIVERT) 25 MG tablet Take 25 mg by mouth 3 (three) times daily as needed for dizziness.    . metoprolol succinate (TOPROL-XL) 100 MG 24 hr tablet Take 1 tablet (100 mg total) by mouth daily. Take with or immediately following a meal. 30 tablet 0  . zolpidem (AMBIEN CR) 12.5 MG CR tablet Take 12.5 mg by mouth daily.     No current  facility-administered medications on file prior to visit.    ALLERGIES: Allergies  Allergen Reactions  . Sulfamethoxazole Hives  . Triamcinolone Acetonide Hives    FAMILY HISTORY: Family History  Problem Relation Age of Onset  . Colon cancer Mother   . Hypertension Father   . Diabetes Sister     SOCIAL HISTORY: Social History   Socioeconomic History  . Marital status: Married    Spouse name: Not on file  . Number of children: Not on file  . Years of education: Not on file  . Highest education level: Not on file  Occupational History  . Not on file   Tobacco Use  . Smoking status: Former Smoker    Types: Cigarettes  . Smokeless tobacco: Never Used  Substance and Sexual Activity  . Alcohol use: Yes    Comment: seldom  . Drug use: Not on file  . Sexual activity: Yes  Other Topics Concern  . Not on file  Social History Narrative  . Not on file   Social Determinants of Health   Financial Resource Strain: Not on file  Food Insecurity: Not on file  Transportation Needs: Not on file  Physical Activity: Not on file  Stress: Not on file  Social Connections: Not on file  Intimate Partner Violence: Not on file    Objective:  Blood pressure (!) 145/89, pulse 85, height 5\' 5"  (1.651 m), weight 183 lb 3.2 oz (83.1 kg), SpO2 97 %. General: No acute distress.  Patient appears well-groomed.   Head:  Normocephalic/atraumatic Eyes:  fundi examined but not visualized Neck: supple, no paraspinal tenderness, full range of motion Back: No paraspinal tenderness Heart: regular rate and rhythm Lungs: Clear to auscultation bilaterally. Vascular: No carotid bruits. Neurological Exam: Mental status: alert and oriented to person, place, and time, recent and remote memory intact, fund of knowledge intact, attention and concentration intact, speech fluent and not dysarthric, language intact. Cranial nerves: CN I: not tested CN II: pupils equal, round and reactive to light, visual fields intact CN III, IV, VI:  Left eye abducted on primary gaze with reduced movement when looking to right.  No nystagmus, no ptosis CN V: facial sensation intact. CN VII: questionable slight right lower facial droop CN VIII: hearing intact CN IX, X: gag intact, uvula midline CN XI: sternocleidomastoid and trapezius muscles intact CN XII: tongue midline Bulk & Tone: normal, no fasciculations. Motor:  muscle strength 5/5 throughout Sensation:  Pinprick, temperature and vibratory sensation intact. Deep Tendon Reflexes:  2+ throughout,  toes downgoing.   Finger to  nose testing:  Without dysmetria.   Heel to shin:  Without dysmetria.   Gait:  Cautious but steady.  Able to turn and tandem walk.  Romberg negative.  Assessment/Plan:   Balance disorder Numbness and tingling  Symptoms may be due to uncontrolled diabetes.  Not consistent with BPPV.  I would definitely look for a secondary intracranial etiology.  He doesn't appear to have any significant neuropathy in the feet.  He also does not appear to have any UMN signs to suggest cervical myelopathy, although would still consider if MRI brain unremarkable.  Recommend MRI of brain Further recommendations pending results Follow up after testing.  Thank you for allowing me to take part in the care of this patient.  , DO  CC:  Shon Millet, MD  Assunta Found, MD

## 2020-03-15 ENCOUNTER — Encounter: Payer: Self-pay | Admitting: Neurology

## 2020-03-15 ENCOUNTER — Telehealth: Payer: Self-pay | Admitting: Neurology

## 2020-03-15 ENCOUNTER — Other Ambulatory Visit: Payer: Self-pay

## 2020-03-15 ENCOUNTER — Ambulatory Visit (INDEPENDENT_AMBULATORY_CARE_PROVIDER_SITE_OTHER): Payer: 59 | Admitting: Neurology

## 2020-03-15 VITALS — BP 145/89 | HR 85 | Ht 65.0 in | Wt 183.2 lb

## 2020-03-15 DIAGNOSIS — R42 Dizziness and giddiness: Secondary | ICD-10-CM

## 2020-03-15 DIAGNOSIS — E1165 Type 2 diabetes mellitus with hyperglycemia: Secondary | ICD-10-CM

## 2020-03-15 DIAGNOSIS — R200 Anesthesia of skin: Secondary | ICD-10-CM

## 2020-03-15 DIAGNOSIS — R202 Paresthesia of skin: Secondary | ICD-10-CM

## 2020-03-15 DIAGNOSIS — R2689 Other abnormalities of gait and mobility: Secondary | ICD-10-CM | POA: Diagnosis not present

## 2020-03-15 NOTE — Patient Instructions (Signed)
1.  Call your insurance to find out if the MRI of the brain is still valid.  If it is, contact neurosurgery to find out where it needs to be scheduled.  If the MRI approval is expired, ask the insurance if it may be reordered by a different doctor since you have now seen a neurologist who thinks it is necessary  2.  Follow up after testing but further recommendations pending results.

## 2020-03-15 NOTE — Telephone Encounter (Signed)
Per pt that his insurance stated the MRI that the [facility] order did expire.  Pt wanted to know if DR.Everlena Cooper would go ahead and Reorder the MRI?  Chelsea it looks like he already got the approval. In the note below is the PA#

## 2020-03-15 NOTE — Telephone Encounter (Signed)
Yes, I would like to order MRI of brain with and without contrast

## 2020-03-15 NOTE — Telephone Encounter (Signed)
Patient called in stating he contacted his insurance company about the MRI Dr. Everlena Cooper wanted him to have. They gave him a PA Number of [id number]. They said to send that number in with the request.

## 2020-03-16 NOTE — Telephone Encounter (Signed)
Once ordered I will add the auth info to the referral. Thank you for the info!

## 2020-03-16 NOTE — Telephone Encounter (Signed)
Order placed per pt and DR.JAffe

## 2020-03-21 ENCOUNTER — Encounter: Payer: Self-pay | Admitting: *Deleted

## 2020-03-21 ENCOUNTER — Encounter: Payer: Self-pay | Admitting: Cardiology

## 2020-03-21 DIAGNOSIS — R269 Unspecified abnormalities of gait and mobility: Secondary | ICD-10-CM

## 2020-03-21 HISTORY — DX: Unspecified abnormalities of gait and mobility: R26.9

## 2020-03-22 ENCOUNTER — Telehealth: Payer: Self-pay | Admitting: Neurology

## 2020-03-22 NOTE — Telephone Encounter (Signed)
Telephone call to the [facility] Need to change order to MRI Brain W/O Contrast.    Order changed.

## 2020-03-22 NOTE — Telephone Encounter (Signed)
Patient states that he got a call from his PCP and states that his Liver and Kidney blood work came back high, kidney levels 1.36 and liver 78. He is suppose to have a MRI with Contrast on Thursday next week. He is concern about having that done with his levels high. He only has one kidney please call

## 2020-03-22 NOTE — Telephone Encounter (Signed)
It would be okay to order MRI brain without contrast

## 2020-03-23 ENCOUNTER — Emergency Department (HOSPITAL_COMMUNITY)
Admission: EM | Admit: 2020-03-23 | Discharge: 2020-03-23 | Disposition: A | Discharge status: Home / Self Care | Payer: 59 | Attending: Emergency Medicine | Admitting: Emergency Medicine

## 2020-03-23 ENCOUNTER — Encounter (HOSPITAL_COMMUNITY): Payer: Self-pay | Admitting: Emergency Medicine

## 2020-03-23 ENCOUNTER — Other Ambulatory Visit: Payer: Self-pay

## 2020-03-23 DIAGNOSIS — E860 Dehydration: Secondary | ICD-10-CM | POA: Insufficient documentation

## 2020-03-23 DIAGNOSIS — I10 Essential (primary) hypertension: Secondary | ICD-10-CM | POA: Diagnosis present

## 2020-03-23 DIAGNOSIS — I160 Hypertensive urgency: Secondary | ICD-10-CM | POA: Diagnosis not present

## 2020-03-23 DIAGNOSIS — Z87891 Personal history of nicotine dependence: Secondary | ICD-10-CM | POA: Insufficient documentation

## 2020-03-23 DIAGNOSIS — Z79899 Other long term (current) drug therapy: Secondary | ICD-10-CM | POA: Diagnosis not present

## 2020-03-23 DIAGNOSIS — E119 Type 2 diabetes mellitus without complications: Secondary | ICD-10-CM | POA: Insufficient documentation

## 2020-03-23 LAB — BASIC METABOLIC PANEL
Anion gap: 11 (ref 5–15)
BUN: 12 mg/dL (ref 6–20)
CO2: 26 mmol/L (ref 22–32)
Calcium: 10.3 mg/dL (ref 8.9–10.3)
Chloride: 100 mmol/L (ref 98–111)
Creatinine, Ser: 1.28 mg/dL — ABNORMAL HIGH (ref 0.61–1.24)
GFR, Estimated: >60 mL/min (ref >60)
Glucose, Bld: 130 mg/dL — ABNORMAL HIGH (ref 70–99)
Potassium: 3.3 mmol/L — ABNORMAL LOW (ref 3.5–5.1)
Sodium: 137 mmol/L (ref 135–145)

## 2020-03-23 LAB — URINALYSIS, ROUTINE W REFLEX MICROSCOPIC
Bacteria, UA: NONE SEEN
Bilirubin Urine: NEGATIVE
Glucose, UA: NEGATIVE mg/dL
Ketones, ur: NEGATIVE mg/dL
Leukocytes,Ua: NEGATIVE
Nitrite: NEGATIVE
Protein, ur: 30 mg/dL — AB
Specific Gravity, Urine: 1.005 (ref 1.005–1.030)
pH: 8 (ref 5.0–8.0)

## 2020-03-23 LAB — CBG MONITORING, ED
Glucose-Capillary: 130 mg/dL — ABNORMAL HIGH (ref 70–99)
Glucose-Capillary: 104 mg/dL — ABNORMAL HIGH (ref 70–99)
Glucose-Capillary: 81 mg/dL (ref 70–99)

## 2020-03-23 LAB — CBC
HCT: 49.4 % (ref 39.0–52.0)
Hemoglobin: 17.1 g/dL — ABNORMAL HIGH (ref 13.0–17.0)
MCH: 28.9 pg (ref 26.0–34.0)
MCHC: 34.6 g/dL (ref 30.0–36.0)
MCV: 83.6 fL (ref 80.0–100.0)
Platelets: 423 10*3/uL — ABNORMAL HIGH (ref 150–400)
RBC: 5.91 MIL/uL — ABNORMAL HIGH (ref 4.22–5.81)
RDW: 11.9 % (ref 11.5–15.5)
WBC: 12.1 10*3/uL — ABNORMAL HIGH (ref 4.0–10.5)
nRBC: 0 % (ref 0.0–0.2)

## 2020-03-23 MED ORDER — LABETALOL HCL 5 MG/ML IV SOLN
10.0000 mg | Freq: Once | INTRAVENOUS | Status: DC
  Filled 2020-03-23: qty 4

## 2020-03-23 MED ORDER — SODIUM CHLORIDE 0.9 % IV BOLUS
1000.0000 mL | Freq: Once | INTRAVENOUS | Status: AC
  Administered 2020-03-23: 1000 mL via INTRAVENOUS

## 2020-03-23 NOTE — ED Notes (Signed)
Patient requesting to leave, Dr. Jeraldine Loots made aware of same, to proceed with discharge. Patient and visitor updated on same.

## 2020-03-23 NOTE — Discharge Instructions (Addendum)
As discussed, your evaluation today has been largely reassuring.  But, it is important that you monitor your condition carefully, and do not hesitate to return to the ED if you develop new, or concerning changes in your condition. Please be sure to follow-up with your physician and your endocrinologist.  Discuss your medication regimen.

## 2020-03-23 NOTE — ED Notes (Signed)
NS still infusing, family remains at bedside. Patient remains alert and oriented. NAD noted.

## 2020-03-23 NOTE — ED Provider Notes (Signed)
[facility] EMERGENCY DEPARTMENT Provider Note   CSN: [id number] Arrival date & time: 03/23/20  1514     History Chief Complaint  Patient presents with  . Hypertension    David Gill is a 60 y.o. male.  HPI Patient presents with concern of hypertension, challenges with managing diabetes. He has no focal complaints of pain, nausea, vomiting, confusion, dyspnea. He notes that only about 2 weeks ago he was diagnosed with diabetes.  He is since been prescribed 3 medications in addition to his substantial medication regimen for hypertension.  In spite of taking his medication as directed he has had labile glucose over the past few days with multiple readings that were critically low.  He is taking Metformin, Januvia and glimepiride.  Blood pressure medication regimen includes Norvasc and hydrochlorothiazide.    Past Medical History:  Diagnosis Date  . Abnormal gait 03/21/2020  . Adjustment disorder with emotional disturbance 08/13/2019   Formatting of this note might be different from the original. 2021  . Allergic rhinitis 10/14/2019  . Anxiety 10/23/2019  . Atrophic kidney 10/24/2019   Formatting of this note might be different from the original. 2021:  CT right hydronephrosis  . Back pain   . Diabetes mellitus without complication (HCC)   . Dizziness 10/23/2019  . Essential hypertension 10/23/2019  . Exotropia, left eye 09/13/2015  . History of tobacco abuse   . Hyperglycemia   . Hypertension   . Hypokalemia 10/23/2019  . Insomnia   . Left lumbar radiculitis 02/23/2019   Formatting of this note might be different from the original. 2021: MRI DDD with impingement  . Low testosterone level in male   . Macular scar of both eyes 09/13/2015  . Metabolic acidosis with increased anion gap and accumulation of organic acids 10/23/2019  . Osteoarthritis 10/23/2019  . SIRS (systemic inflammatory response syndrome) (HCC) 10/23/2019  . Tachycardia 10/23/2019    Patient  Active Problem List   Diagnosis Date Noted  . Abnormal gait 03/21/2020  . Body mass index (BMI) 32.0-32.9, adult 02/10/2020  . Generalized abdominal pain 11/23/2019  . Atrophic kidney 10/24/2019  . Tachycardia 10/23/2019  . SIRS (systemic inflammatory response syndrome) (HCC) 10/23/2019  . Essential hypertension 10/23/2019  . Hypokalemia 10/23/2019  . Metabolic acidosis with increased anion gap and accumulation of organic acids 10/23/2019  . Dizziness 10/23/2019  . Anxiety 10/23/2019  . Osteoarthritis 10/23/2019  . Allergic rhinitis 10/14/2019  . Adjustment disorder with emotional disturbance 08/13/2019  . Left lumbar radiculitis 02/23/2019  . Exotropia, left eye 09/13/2015  . Macular scar of both eyes 09/13/2015    Past Surgical History:  Procedure Laterality Date  . TONSILLECTOMY         Family History  Problem Relation Age of Onset  . Colon cancer Mother   . Heart attack Mother   . CAD Mother   . Hypertension Father   . Diabetes Sister   . Diabetes Brother   . Breast cancer Maternal Grandmother   . Stroke Maternal Grandfather   . Diabetes Paternal Grandmother   . CAD Paternal Grandfather     Social History   Tobacco Use  . Smoking status: Former Smoker    Types: Cigarettes  . Smokeless tobacco: Never Used  Substance Use Topics  . Alcohol use: Yes    Comment: seldom  . Drug use: Never    Home Medications Prior to Admission medications   Medication Sig Start Date End Date Taking? Authorizing Provider  acetaminophen (TYLENOL)  500 MG tablet Take 500 mg by mouth every 6 (six) hours as needed for moderate pain.    [provider]  amLODipine (NORVASC) 5 MG tablet Take 5 mg by mouth daily. 01/30/20   [provider]  cetirizine (ZYRTEC) 10 MG tablet Take 10 mg by mouth daily.    [provider]  fenofibrate 160 MG tablet Take 160 mg by mouth daily. 03/07/20   [provider]  fluticasone (FLONASE) 50 MCG/ACT nasal spray Place  1 spray into both nostrils in the morning and at bedtime.    [provider]  glimepiride (AMARYL) 2 MG tablet Take 2 mg by mouth in the morning and at bedtime. 03/07/20   [provider]  hydrochlorothiazide (MICROZIDE) 12.5 MG capsule Take 12.5 mg by mouth daily.    [provider]  hydrOXYzine (ATARAX/VISTARIL) 25 MG tablet Take 1 tablet (25 mg total) by mouth 3 (three) times daily as needed for up to 30 doses for anxiety. 10/26/19   Lanae Boast, MD  omega-3 acid ethyl esters (LOVAZA) 1 g capsule Take 2 capsules by mouth 2 (two) times daily. 03/07/20   [provider]  sitaGLIPtin-metformin (JANUMET) 50-500 MG tablet Take 1 tablet by mouth 2 (two) times daily with a meal.  03/23/20  [provider]    Allergies    Sulfamethoxazole and Triamcinolone acetonide  Review of Systems   Review of Systems  Constitutional:       Per HPI, otherwise negative  HENT:       Per HPI, otherwise negative  Respiratory:       Per HPI, otherwise negative  Cardiovascular:       Per HPI, otherwise negative  Gastrointestinal: Positive for nausea. Negative for vomiting.  Endocrine:       Negative aside from HPI  Genitourinary:       Neg aside from HPI   Musculoskeletal:       Per HPI, otherwise negative  Skin: Negative.   Neurological: Positive for weakness. Negative for syncope.    Physical Exam Updated Vital Signs BP 132/81   Pulse 76   Temp 98.2 F (36.8 C) (Oral)   Resp 16   SpO2 97%   Physical Exam Vitals and nursing note reviewed.  Constitutional:      General: He is not in acute distress.    Appearance: He is well-developed.  HENT:     Head: Normocephalic and atraumatic.  Eyes:     Extraocular Movements: EOM normal.     Conjunctiva/sclera: Conjunctivae normal.  Cardiovascular:     Rate and Rhythm: Normal rate and regular rhythm.  Pulmonary:     Effort: Pulmonary effort is normal. No respiratory distress.     Breath sounds: No stridor.   Abdominal:     General: There is no distension.  Musculoskeletal:        General: No edema.  Skin:    General: Skin is warm and dry.  Neurological:     Mental Status: He is alert and oriented to person, place, and time.  Psychiatric:        Mood and Affect: Mood and affect normal.     ED Results / Procedures / Treatments   Labs (all labs ordered are listed, but only abnormal results are displayed) Labs Reviewed  BASIC METABOLIC PANEL - Abnormal; Notable for the following components:      Result Value   Potassium 3.3 (*)    Glucose, Bld 130 (*)  Creatinine, Ser 1.28 (*)    All other components within normal limits  CBC - Abnormal; Notable for the following components:   WBC 12.1 (*)    RBC 5.91 (*)    Hemoglobin 17.1 (*)    Platelets 423 (*)    All other components within normal limits  URINALYSIS, ROUTINE W REFLEX MICROSCOPIC - Abnormal; Notable for the following components:   Color, Urine STRAW (*)    Hgb urine dipstick SMALL (*)    Protein, ur 30 (*)    All other components within normal limits  CBG MONITORING, ED - Abnormal; Notable for the following components:   Glucose-Capillary 130 (*)    All other components within normal limits  CBG MONITORING, ED - Abnormal; Notable for the following components:   Glucose-Capillary 104 (*)    All other components within normal limits  CBG MONITORING, ED    EKG None  Radiology No results found.  Procedures Procedures   Medications Ordered in ED Medications  labetalol (NORMODYNE) injection 10 mg (0 mg Intravenous Hold 03/23/20 1836)  sodium chloride 0.9 % bolus 1,000 mL (0 mLs Intravenous Stopped 03/23/20 2145)    ED Course  I have reviewed the triage vital signs and the nursing notes.  Pertinent labs & imaging results that were available during my care of the patient were reviewed by me and considered in my medical decision making (see chart for details).  On repeat exam patient is awake, alert, blood pressure  has diminished approximately 20% since arrival.  He notes that he feels better.  He has received fluid resuscitation given his labs which were notably abnormal for elevated creatinine.  No hypoglycemia, no other substantial abnormalities. He and I discussed at length medication regimen for blood pressure control, diabetes.  Patient will have adjusted regimen, will follow up with his primary care physician and his endocrinologist.  Today, no evidence for endorgan dysfunction, no nervous systems he had with his improved blood pressure values, patient is appropriate for discharge, to which he is amenable. Final Clinical Impression(s) / ED Diagnoses Final diagnoses:  Hypertensive urgency  Dehydration    Rx / DC Orders ED Discharge Orders    None       Gerhard Munch, MD 03/23/20 2319

## 2020-03-23 NOTE — ED Triage Notes (Signed)
Patient here with complaint of hypertension and difficult managing his blood sugar after being recently diagnosed with diabetes. Denies any other complaints.

## 2020-03-27 NOTE — Telephone Encounter (Signed)
Pt advised of MRI changed to With out Contrast due to Creatine levels

## 2020-03-30 ENCOUNTER — Ambulatory Visit
Admission: RE | Admit: 2020-03-30 | Discharge: 2020-03-30 | Disposition: A | Discharge status: Home / Self Care | Payer: 59 | Source: Ambulatory Visit | Attending: Neurology | Admitting: Neurology

## 2020-03-30 ENCOUNTER — Other Ambulatory Visit: Payer: Self-pay

## 2020-03-30 DIAGNOSIS — R42 Dizziness and giddiness: Secondary | ICD-10-CM

## 2020-03-30 DIAGNOSIS — R200 Anesthesia of skin: Secondary | ICD-10-CM

## 2020-03-30 DIAGNOSIS — R2689 Other abnormalities of gait and mobility: Secondary | ICD-10-CM

## 2020-03-30 IMAGING — MR MR HEAD W/O CM
10 series · 48 of 48 positions shown · non-contrast
Comparison: None.

CLINICAL DATA: Dizziness. Ataxia. Numbness and tingling of the
extremities.

EXAM:
MRI HEAD WITHOUT CONTRAST
TECHNIQUE: Multiplanar, multiecho pulse sequences of the brain and surrounding
structures were obtained without intravenous contrast.

[Series 2: T1 · sagittal · 5.0mm · 0.45mm/px · 3 of 21 slices shown]
[im 1/21]
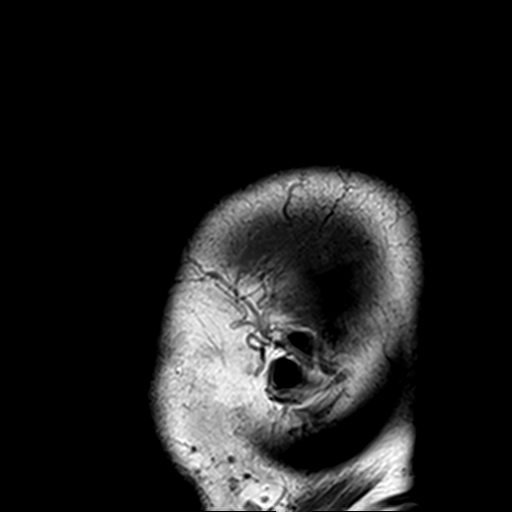
[im 11/21]
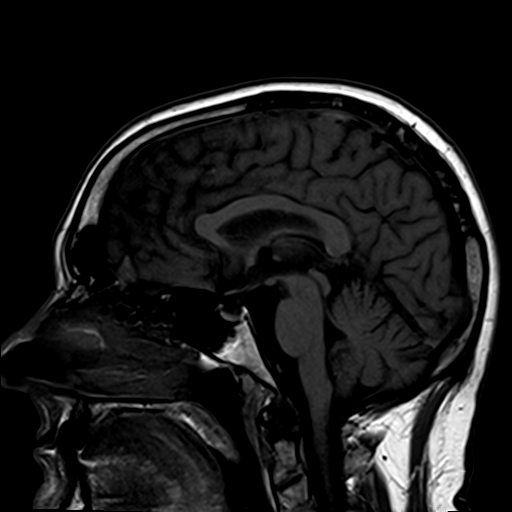
[im 21/21]
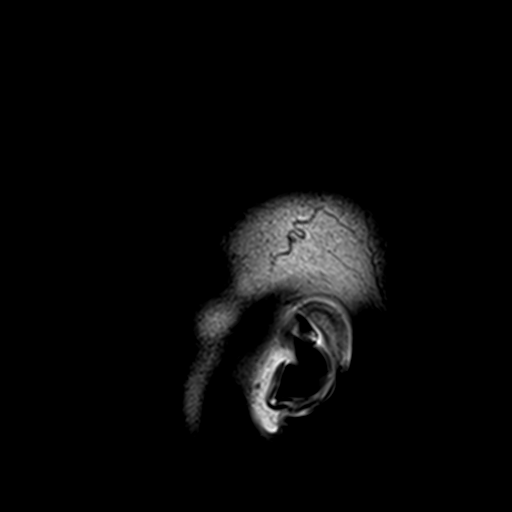

[Series 3: DWI · axial · 3.0mm · 1.88mm/px · z∈[-98,+49]mm · 9 of 100 slices shown (1 of 4)]
[im 1/100]
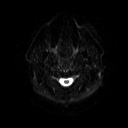
[im 13/100]
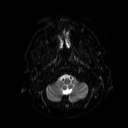
[im 25/100]
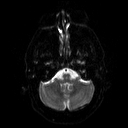
[im 38/100]
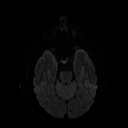
[im 50/100]
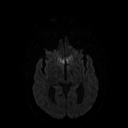
[im 62/100]
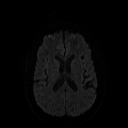
[im 75/100]
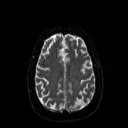
[im 87/100]
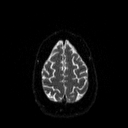
[im 100/100]
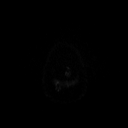

[Series 4: DWI · axial · 3.0mm · 1.88mm/px · z∈[-98,+49]mm · 4 of 49 slices shown (2 of 4)]
[im 1/49]
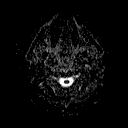
[im 17/49]
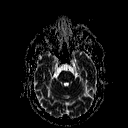
[im 33/49]
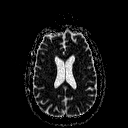
[im 49/49]
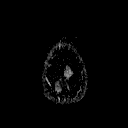

[Series 5: DWI · coronal · 5.0mm · 1.80mm/px · 6 of 67 slices shown (3 of 4)]
[im 1/67]
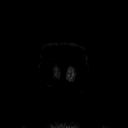
[im 14/67]
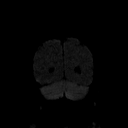
[im 27/67]
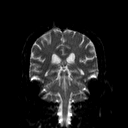
[im 40/67]
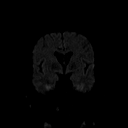
[im 53/67]
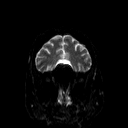
[im 67/67]
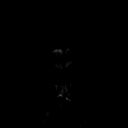

[Series 6: DWI · coronal · 5.0mm · 1.80mm/px · 3 of 34 slices shown (4 of 4)]
[im 1/34]
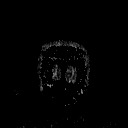
[im 17/34]
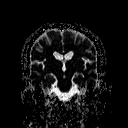
[im 34/34]
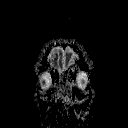

[Series 7: T2 · axial · 5.0mm · 0.51mm/px · z∈[-98,+49]mm · 2 of 22 slices shown (1 of 2)]
[im 1/22]
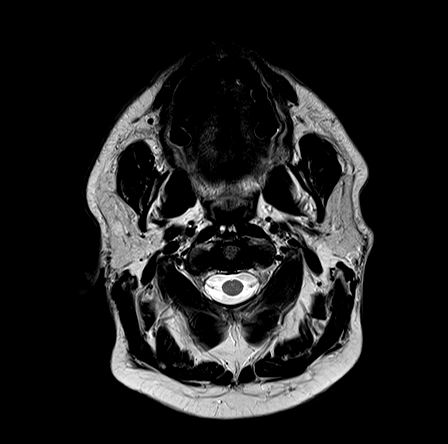
[im 22/22]
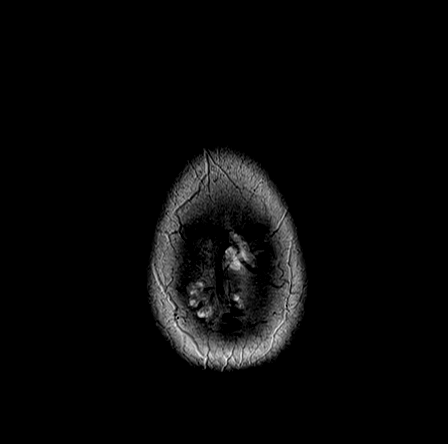

[Series 8: FLAIR · axial · 3.0mm · 0.45mm/px · z∈[-94,+45]mm · 3 of 31 slices shown]
[im 1/31]
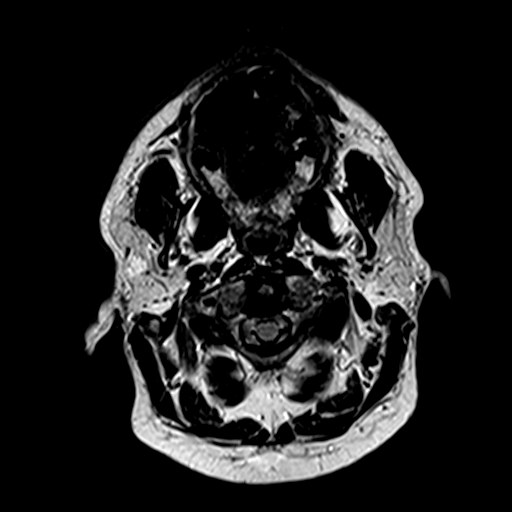
[im 16/31]
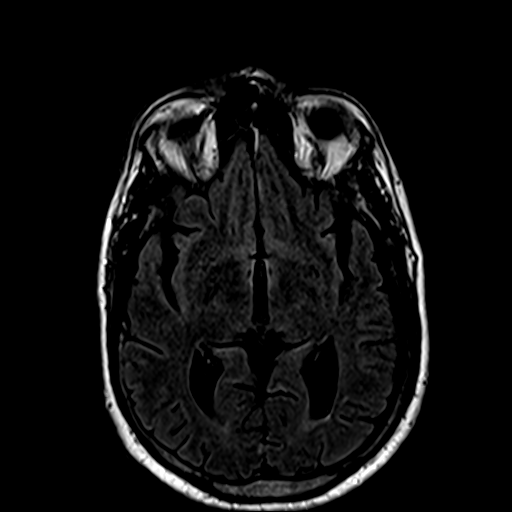
[im 31/31]
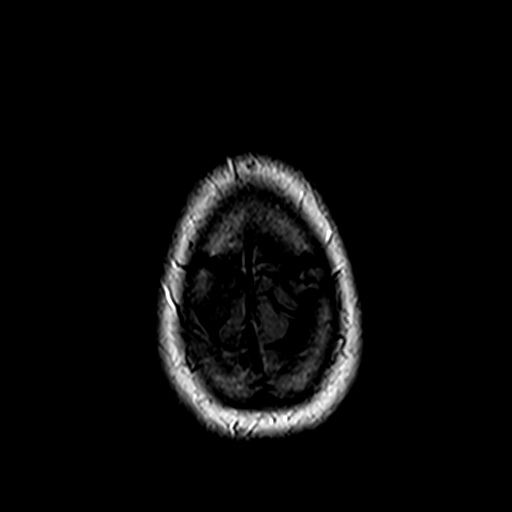

[Series 10: swi_images · axial · 4.0mm · 0.90mm/px · z∈[-94,+45]mm · 3 of 36 slices shown]
[im 1/36]
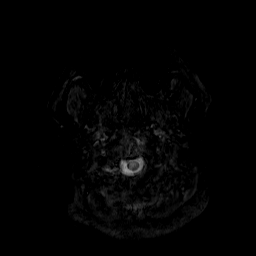
[im 18/36]
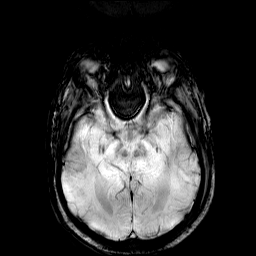
[im 36/36]
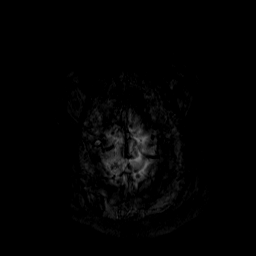

[Series 11: t1_mpr_tra · axial · 1.0mm · 0.71mm/px · z∈[-96,+47]mm · 13 of 144 slices shown]
[im 1/144]
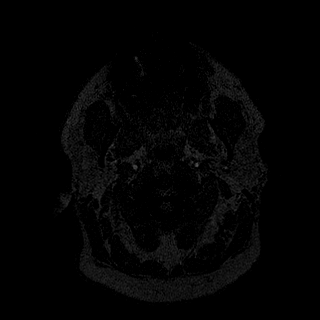
[im 12/144]
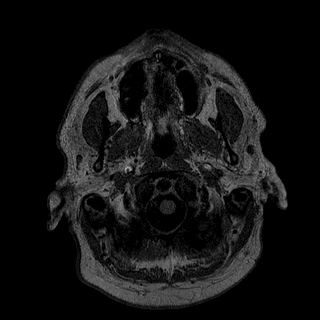
[im 24/144]
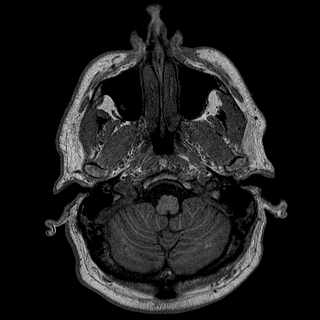
[im 36/144]
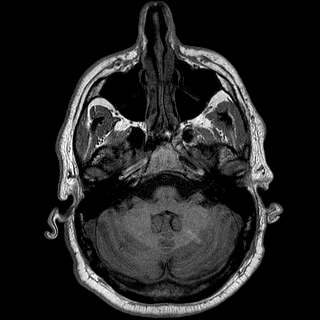
[im 48/144]
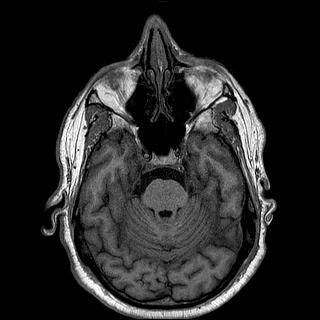
[im 60/144]
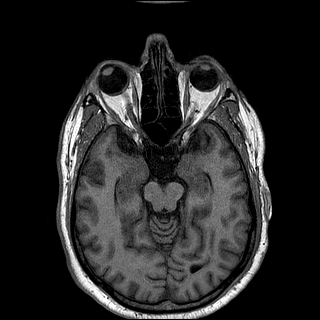
[im 72/144]
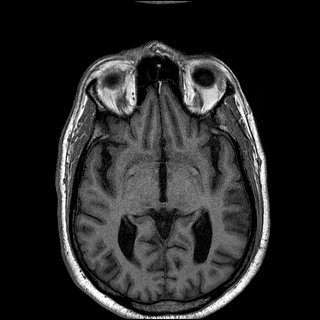
[im 84/144]
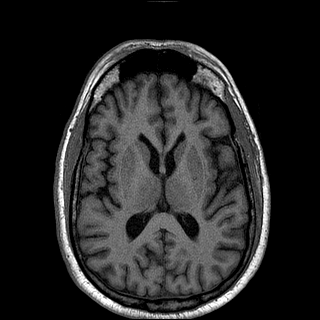
[im 96/144]
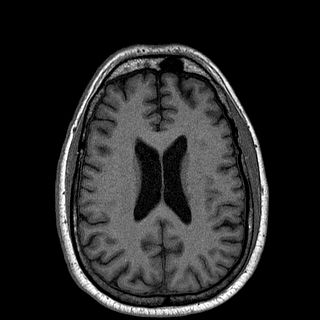
[im 108/144]
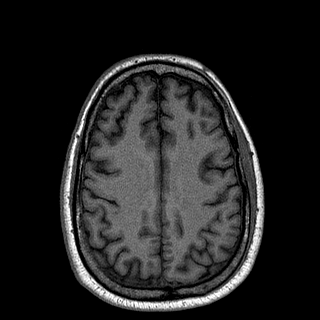
[im 120/144]
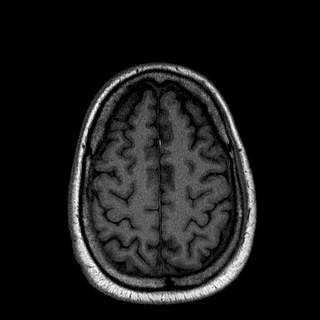
[im 132/144]
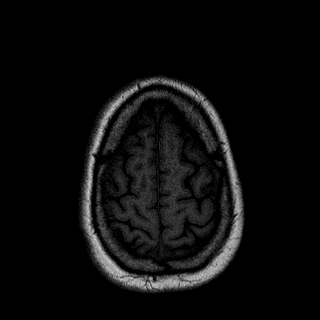
[im 144/144]
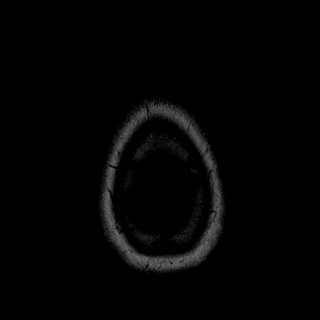

[Series 12: T2 · coronal · 5.0mm · 0.45mm/px · 2 of 25 slices shown (2 of 2)]
[im 1/25]
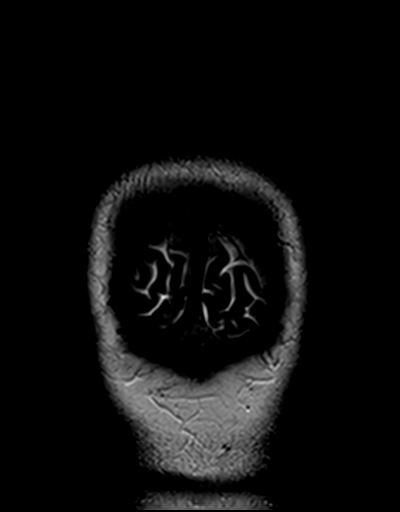
[im 25/25]
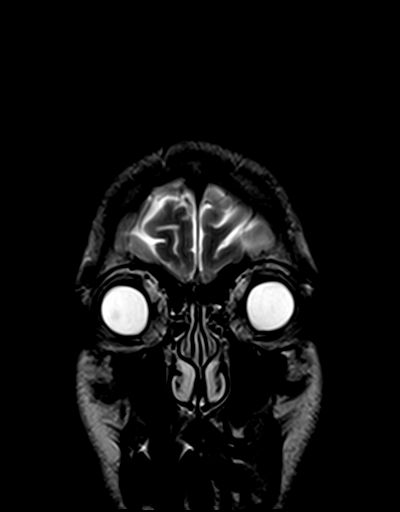

[48 of 48 positions shown; findings below may reference images not displayed]

FINDINGS: Brain: Diffusion imaging does not show any acute or subacute
infarction. The brainstem and cerebellum are normal. Cerebral
hemispheres are normal except for a very few punctate foci of T2 and
FLAIR signal within the white matter, not likely of any clinical
significance. There is not a pattern of disease to suggest
demyelinating disease/multiple sclerosis. Findings are not
widespread enough to suggest small-vessel disease. No cortical or
large vessel territory abnormality. No mass, hemorrhage,
hydrocephalus or extra-axial collection.

Vascular: Major vessels at the base of the brain show flow. Venous
sinuses appear patent.

Skull and upper cervical spine: Normal.

Sinuses/Orbits: Clear/normal.

Other: None significant.
IMPRESSION: No acute or reversible finding. No specific cause of the presenting
symptoms is identified. Normal study, with the exception of a few
punctate foci of T2 and FLAIR signal within the white matter, not
likely significant.

## 2020-03-31 NOTE — Progress Notes (Signed)
Pt advised of MRI results.

## 2020-04-06 ENCOUNTER — Other Ambulatory Visit: Payer: Self-pay

## 2020-04-06 DIAGNOSIS — G4700 Insomnia, unspecified: Secondary | ICD-10-CM | POA: Insufficient documentation

## 2020-04-06 DIAGNOSIS — Z87891 Personal history of nicotine dependence: Secondary | ICD-10-CM | POA: Insufficient documentation

## 2020-04-06 DIAGNOSIS — R7989 Other specified abnormal findings of blood chemistry: Secondary | ICD-10-CM | POA: Insufficient documentation

## 2020-04-06 DIAGNOSIS — I10 Essential (primary) hypertension: Secondary | ICD-10-CM | POA: Insufficient documentation

## 2020-04-06 DIAGNOSIS — M549 Dorsalgia, unspecified: Secondary | ICD-10-CM | POA: Insufficient documentation

## 2020-04-06 DIAGNOSIS — E119 Type 2 diabetes mellitus without complications: Secondary | ICD-10-CM | POA: Insufficient documentation

## 2020-04-06 DIAGNOSIS — R739 Hyperglycemia, unspecified: Secondary | ICD-10-CM | POA: Insufficient documentation

## 2020-04-07 ENCOUNTER — Other Ambulatory Visit: Payer: Self-pay

## 2020-04-07 ENCOUNTER — Ambulatory Visit (INDEPENDENT_AMBULATORY_CARE_PROVIDER_SITE_OTHER): Payer: 59 | Admitting: Cardiology

## 2020-04-07 ENCOUNTER — Encounter: Payer: Self-pay | Admitting: Cardiology

## 2020-04-07 VITALS — BP 146/88 | HR 78 | Ht 65.0 in | Wt 179.8 lb

## 2020-04-07 DIAGNOSIS — E119 Type 2 diabetes mellitus without complications: Secondary | ICD-10-CM | POA: Diagnosis not present

## 2020-04-07 DIAGNOSIS — E781 Pure hyperglyceridemia: Secondary | ICD-10-CM | POA: Insufficient documentation

## 2020-04-07 DIAGNOSIS — I10 Essential (primary) hypertension: Secondary | ICD-10-CM | POA: Diagnosis not present

## 2020-04-07 DIAGNOSIS — E785 Hyperlipidemia, unspecified: Secondary | ICD-10-CM | POA: Insufficient documentation

## 2020-04-07 DIAGNOSIS — N261 Atrophy of kidney (terminal): Secondary | ICD-10-CM

## 2020-04-07 DIAGNOSIS — Z87891 Personal history of nicotine dependence: Secondary | ICD-10-CM

## 2020-04-07 HISTORY — DX: Pure hyperglyceridemia: E78.1

## 2020-04-07 HISTORY — DX: Hyperlipidemia, unspecified: E78.5

## 2020-04-07 NOTE — Patient Instructions (Signed)
Medication Instructions:  Your physician has recommended you make the following change in your medication:   Decrease your Metoprolol to 50 mg daily. Stop current fish oil  *If you need a refill on your cardiac medications before your next appointment, please call your pharmacy*   Lab Work: Your physician recommends that you have labs done in the office today. Your test included  basic metabolic panel and liver function.  If you have labs (blood work) drawn today and your tests are completely normal, you will receive your results only by: Marland Kitchen MyChart Message (if you have MyChart) OR . A paper copy in the mail If you have any lab test that is abnormal or we need to change your treatment, we will call you to review the results.   Testing/Procedures: None ordered   Follow-Up: At [facility], you and your health needs are our priority.  As part of our continuing mission to provide you with exceptional heart care, we have created designated Provider Care Teams.  These Care Teams include your primary Cardiologist (physician) and Advanced Practice Providers (APPs -  Physician Assistants and Nurse Practitioners) who all work together to provide you with the care you need, when you need it.  We recommend signing up for the patient portal called "MyChart".  Sign up information is provided on this After Visit Summary.  MyChart is used to connect with patients for Virtual Visits (Telemedicine).  Patients are able to view lab/test results, encounter notes, upcoming appointments, etc.  Non-urgent messages can be sent to your provider as well.   To learn more about what you can do with MyChart, go to [email or web address].    Your next appointment:   2 month(s)  The format for your next appointment:   In Person  Provider:   Belva Crome, MD   Other Instructions  Diabetes Mellitus and Nutrition, Adult When you have diabetes, or diabetes mellitus, it is very important to have healthy  eating habits because your blood sugar (glucose) levels are greatly affected by what you eat and drink. Eating healthy foods in the right amounts, at about the same times every day, can help you:  Control your blood glucose.  Lower your risk of heart disease.  Improve your blood pressure.  Reach or maintain a healthy weight. What can affect my meal plan? Every person with diabetes is different, and each person has different needs for a meal plan. Your health care provider may recommend that you work with a dietitian to make a meal plan that is best for you. Your meal plan may vary depending on factors such as:  The calories you need.  The medicines you take.  Your weight.  Your blood glucose, blood pressure, and cholesterol levels.  Your activity level.  Other health conditions you have, such as heart or kidney disease. How do carbohydrates affect me? Carbohydrates, also called carbs, affect your blood glucose level more than any other type of food. Eating carbs naturally raises the amount of glucose in your blood. Carb counting is a method for keeping track of how many carbs you eat. Counting carbs is important to keep your blood glucose at a healthy level, especially if you use insulin or take certain oral diabetes medicines. It is important to know how many carbs you can safely have in each meal. This is different for every person. Your dietitian can help you calculate how many carbs you should have at each meal and for each snack. How does alcohol affect  me? Alcohol can cause a sudden decrease in blood glucose (hypoglycemia), especially if you use insulin or take certain oral diabetes medicines. Hypoglycemia can be a life-threatening condition. Symptoms of hypoglycemia, such as sleepiness, dizziness, and confusion, are similar to symptoms of having too much alcohol.  Do not drink alcohol if: ? Your health care provider tells you not to drink. ? You are pregnant, may be pregnant, or  are planning to become pregnant.  If you drink alcohol: ? Do not drink on an empty stomach. ? Limit how much you use to:  0-1 drink a day for women.  0-2 drinks a day for men. ? Be aware of how much alcohol is in your drink. In the [place] one drink equals one 12 oz bottle of beer (355 mL), one 5 oz glass of wine (148 mL), or one 1 oz glass of hard liquor (44 mL). ? Keep yourself hydrated with water, diet soda, or unsweetened iced tea.  Keep in mind that regular soda, juice, and other mixers may contain a lot of sugar and must be counted as carbs. What are tips for following this plan? Reading food labels  Start by checking the serving size on the "Nutrition Facts" label of packaged foods and drinks. The amount of calories, carbs, fats, and other nutrients listed on the label is based on one serving of the item. Many items contain more than one serving per package.  Check the total grams (g) of carbs in one serving. You can calculate the number of servings of carbs in one serving by dividing the total carbs by 15. For example, if a food has 30 g of total carbs per serving, it would be equal to 2 servings of carbs.  Check the number of grams (g) of saturated fats and trans fats in one serving. Choose foods that have a low amount or none of these fats.  Check the number of milligrams (mg) of salt (sodium) in one serving. Most people should limit total sodium intake to less than 2,300 mg per day.  Always check the nutrition information of foods labeled as "low-fat" or "nonfat." These foods may be higher in added sugar or refined carbs and should be avoided.  Talk to your dietitian to identify your daily goals for nutrients listed on the label. Shopping  Avoid buying canned, pre-made, or processed foods. These foods tend to be high in fat, sodium, and added sugar.  Shop around the outside edge of the grocery store. This is where you will most often find fresh fruits and vegetables, bulk  grains, fresh meats, and fresh dairy. Cooking  Use low-heat cooking methods, such as baking, instead of high-heat cooking methods like deep frying.  Cook using healthy oils, such as olive, canola, or sunflower oil.  Avoid cooking with butter, cream, or high-fat meats. Meal planning  Eat meals and snacks regularly, preferably at the same times every day. Avoid going long periods of time without eating.  Eat foods that are high in fiber, such as fresh fruits, vegetables, beans, and whole grains. Talk with your dietitian about how many servings of carbs you can eat at each meal.  Eat 4-6 oz (112-168 g) of lean protein each day, such as lean meat, chicken, fish, eggs, or tofu. One ounce (oz) of lean protein is equal to: ? 1 oz (28 g) of meat, chicken, or fish. ? 1 egg. ?  cup (62 g) of tofu.  Eat some foods each day that contain healthy fats,  such as avocado, nuts, seeds, and fish.   What foods should I eat? Fruits Berries. Apples. Oranges. Peaches. Apricots. Plums. Grapes. Mango. Papaya. Pomegranate. Kiwi. Cherries. Vegetables Lettuce. Spinach. Leafy greens, including kale, chard, collard greens, and mustard greens. Beets. Cauliflower. Cabbage. Broccoli. Carrots. Green beans. Tomatoes. Peppers. Onions. Cucumbers. Brussels sprouts. Grains Whole grains, such as whole-wheat or whole-grain bread, crackers, tortillas, cereal, and pasta. Unsweetened oatmeal. Quinoa. Brown or wild rice. Meats and other proteins Seafood. Poultry without skin. Lean cuts of poultry and beef. Tofu. Nuts. Seeds. Dairy Low-fat or fat-free dairy products such as milk, yogurt, and cheese. The items listed above may not be a complete list of foods and beverages you can eat. Contact a dietitian for more information. What foods should I avoid? Fruits Fruits canned with syrup. Vegetables Canned vegetables. Frozen vegetables with butter or cream sauce. Grains Refined white flour and flour products such as bread,  pasta, snack foods, and cereals. Avoid all processed foods. Meats and other proteins Fatty cuts of meat. Poultry with skin. Breaded or fried meats. Processed meat. Avoid saturated fats. Dairy Full-fat yogurt, cheese, or milk. Beverages Sweetened drinks, such as soda or iced tea. The items listed above may not be a complete list of foods and beverages you should avoid. Contact a dietitian for more information. Questions to ask a health care provider  Do I need to meet with a diabetes educator?  Do I need to meet with a dietitian?  What number can I call if I have questions?  When are the best times to check my blood glucose? Where to find more information:  American Diabetes Association: diabetes.org  Academy of Nutrition and Dietetics: www.eatright.[employer] of Diabetes and Digestive and Kidney Diseases: CarFlippers.tn  Association of Diabetes Care and Education Specialists: www.diabeteseducator.org Summary  It is important to have healthy eating habits because your blood sugar (glucose) levels are greatly affected by what you eat and drink.  A healthy meal plan will help you control your blood glucose and maintain a healthy lifestyle.  Your health care provider may recommend that you work with a dietitian to make a meal plan that is best for you.  Keep in mind that carbohydrates (carbs) and alcohol have immediate effects on your blood glucose levels. It is important to count carbs and to use alcohol carefully. This information is not intended to replace advice given to you by your health care provider. Make sure you discuss any questions you have with your health care provider. Document Revised: 12/29/2018 Document Reviewed: 12/29/2018 Elsevier Patient Education  2021 [employer].

## 2020-04-07 NOTE — Progress Notes (Signed)
Cardiology Office Note:    Date:  04/07/2020   ID:  David Gill, DOB 06/14/1960, MRN [id number]  PCP:  Assunta Found, MD  Cardiologist:  Garwin Brothers, MD   Referring MD: Assunta Found, MD    ASSESSMENT:    1. Essential hypertension   2. Diabetes mellitus without complication (HCC)   3. Atrophic kidney   4. History of tobacco abuse   5. Hypertriglyceridemia    PLAN:    In order of problems listed above:  1. I discussed my findings with the patient at length and primary prevention stressed.  Importance of compliance with diet medication stressed any vocalized understanding. 2. Essential hypertension: I checked his blood pressure myself and it was 110/70.  Multiple home readings are similar.  He has an element of whitecoat hypertension.  He mentions to me that since metoprolol was initiated he feels a little woozy.  I will cut down the medication to 50 mg a day and start 100 mg a day.  I would like to see if that helps him. 3. Mixed dyslipidemia: Diet was emphasized.  He is doing very well with diet in the past 1 month.  Labs from January were reviewed.  I reviewed lab work from last year from [employer] and also of hospital records.  He is doing very well with diet and is taking Lovaza 2 g twice daily and fenofibrate.  I think this will help him.  I will also check his Chem-7 and liver panel and consider starting him on statin therapy and this will help his lipids and also secondary prevention and in line with the guideline directed medical therapy for diabetes. 4. Diabetes mellitus: Significantly elevated patient is doing very well with diet and exercise.  I think this will get him controlled in the next several weeks.  He has an appointment with diabetologist also.  Interestingly his sugars were very mildly elevated in later part of last year. 5. Follow-up appointment in 2 months or earlier if he has any concerns.  Patient had multiple questions which were answered to  satisfaction.   Medication Adjustments/Labs and Tests Ordered: Current medicines are reviewed at length with the patient today.  Concerns regarding medicines are outlined above.  Orders Placed This Encounter  Procedures  . Basic metabolic panel  . Hepatic function panel  . EKG 12-Lead   No orders of the defined types were placed in this encounter.    History of Present Illness:    David Gill is a 60 y.o. male who is being seen today for the evaluation of essential hypertension and hypertriglyceridemia at the request of Assunta Found, MD.  Patient is a pleasant 60 year old male.  He has past medical history of essential hypertension.  He recently was diagnosed to have markedly elevated sugars and diabetes mellitus and marked elevation in triglycerides more than 1000.  Patient mentions to me that he is here for evaluation for this.  He denies any chest pain orthopnea or PND.  He leads a sedentary lifestyle.  He mentions to me that he is wobbly on his feet and he is being evaluated for this.  He is being evaluated with MRI of the head.  He has not had contrast because of the fact that he has a single kidney.  Single functioning kidney.  His wife accompanies him for the visit.  At the time of my evaluation, the patient is alert awake oriented and in no distress.  Past Medical History:  Diagnosis Date  . Abnormal gait 03/21/2020  . Adjustment disorder with emotional disturbance 08/13/2019   Formatting of this note might be different from the original. 2021  . Allergic rhinitis 10/14/2019  . Anxiety 10/23/2019  . Atrophic kidney 10/24/2019   Formatting of this note might be different from the original. 2021:  CT right hydronephrosis  . Back pain   . Body mass index (BMI) 32.0-32.9, adult 02/10/2020  . Diabetes mellitus without complication (HCC)   . Dizziness 10/23/2019  . Essential hypertension 10/23/2019  . Exotropia, left eye 09/13/2015  . Generalized abdominal pain 11/23/2019    Formatting of this note might be different from the original. 11/23/2019:  . History of tobacco abuse   . Hyperglycemia   . Hypertension   . Hypokalemia 10/23/2019  . Insomnia   . Left lumbar radiculitis 02/23/2019   Formatting of this note might be different from the original. 2021: MRI DDD with impingement  . Low testosterone level in male   . Macular scar of both eyes 09/13/2015  . Metabolic acidosis with increased anion gap and accumulation of organic acids 10/23/2019  . Osteoarthritis 10/23/2019  . SIRS (systemic inflammatory response syndrome) (HCC) 10/23/2019  . Tachycardia 10/23/2019    Past Surgical History:  Procedure Laterality Date  . TONSILLECTOMY      Current Medications: Current Meds  Medication Sig  . acetaminophen (TYLENOL) 500 MG tablet Take 500 mg by mouth every 6 (six) hours as needed for moderate pain.  Marland Kitchen amLODipine (NORVASC) 5 MG tablet Take 5 mg by mouth daily.  . cetirizine (ZYRTEC) 10 MG tablet Take 10 mg by mouth daily.  . fenofibrate 160 MG tablet Take 160 mg by mouth daily.  . hydrochlorothiazide (MICROZIDE) 12.5 MG capsule Take 12.5 mg by mouth daily.  . hydrOXYzine (ATARAX/VISTARIL) 25 MG tablet Take 1 tablet (25 mg total) by mouth 3 (three) times daily as needed for up to 30 doses for anxiety.  Marland Kitchen JANUMET 50-500 MG tablet Take 1 tablet by mouth daily in the afternoon.  . metoprolol succinate (TOPROL-XL) 100 MG 24 hr tablet Take 50 mg by mouth daily. Take with or immediately following a meal.  . omega-3 acid ethyl esters (LOVAZA) 1 g capsule Take 2 capsules by mouth 2 (two) times daily.  Marland Kitchen zolpidem (AMBIEN CR) 12.5 MG CR tablet Take 12.5 mg by mouth at bedtime as needed for sleep.     Allergies:   Sulfamethoxazole and Triamcinolone acetonide   Social History   Socioeconomic History  . Marital status: Married    Spouse name: Not on file  . Number of children: Not on file  . Years of education: Not on file  . Highest education level: Not on file   Occupational History  . Not on file  Tobacco Use  . Smoking status: Former Smoker    Types: Cigarettes  . Smokeless tobacco: Never Used  Substance and Sexual Activity  . Alcohol use: Yes    Comment: seldom  . Drug use: Never  . Sexual activity: Yes  Other Topics Concern  . Not on file  Social History Narrative   Left handed   Social Determinants of Health   Financial Resource Strain: Not on file  Food Insecurity: Not on file  Transportation Needs: Not on file  Physical Activity: Not on file  Stress: Not on file  Social Connections: Not on file     Family History: The patient's family history includes Breast cancer in his maternal grandmother; CAD in  his mother and paternal grandfather; Colon cancer in his mother; Diabetes in his brother, paternal grandmother, and sister; Heart attack in his mother; Hypertension in his father; Stroke in his maternal grandfather.  ROS:   Please see the history of present illness.    All other systems reviewed and are negative.  EKGs/Labs/Other Studies Reviewed:    The following studies were reviewed today: IMPRESSIONS    1. Left ventricular ejection fraction, by estimation, is 65 to 70%. The  left ventricle has normal function. The left ventricle has no regional  wall motion abnormalities. There is moderate left ventricular hypertrophy.  Left ventricular diastolic  parameters are consistent with Grade I diastolic dysfunction (impaired  relaxation).  2. Right ventricular systolic function is normal. The right ventricular  size is normal.  3. The mitral valve is normal in structure. No evidence of mitral valve  regurgitation. No evidence of mitral stenosis.  4. The aortic valve is normal in structure. Aortic valve regurgitation is  not visualized. No aortic stenosis is present.  5. The inferior vena cava is normal in size with greater than 50%  respiratory variability, suggesting right atrial pressure of 3 mmHg.    Recent  Labs: 10/23/2019: ALT 63; TSH 1.305 10/25/2019: Magnesium 2.5 03/23/2020: BUN 12; Creatinine, Ser 1.28; Hemoglobin 17.1; Platelets 423; Potassium 3.3; Sodium 137  Recent Lipid Panel No results found for: CHOL, TRIG, HDL, CHOLHDL, VLDL, LDLCALC, LDLDIRECT  Physical Exam:    VS:  BP (!) 146/88   Pulse 78   Ht 5\' 5"  (1.651 m)   Wt 179 lb 12.8 oz (81.6 kg)   SpO2 97%   BMI 29.92 kg/m     Wt Readings from Last 3 Encounters:  04/07/20 179 lb 12.8 oz (81.6 kg)  03/15/20 183 lb 3.2 oz (83.1 kg)  10/23/19 187 lb 6.3 oz (85 kg)     GEN: Patient is in no acute distress HEENT: Normal NECK: No JVD; No carotid bruits LYMPHATICS: No lymphadenopathy CARDIAC: S1 S2 regular, 2/6 systolic murmur at the apex. RESPIRATORY:  Clear to auscultation without rales, wheezing or rhonchi  ABDOMEN: Soft, non-tender, non-distended MUSCULOSKELETAL:  No edema; No deformity  SKIN: Warm and dry NEUROLOGIC:  Alert and oriented x 3 PSYCHIATRIC:  Normal affect    Signed, 10/25/19, MD  04/07/2020 4:45 PM    [facility]

## 2020-04-08 LAB — HEPATIC FUNCTION PANEL
ALT: 65 IU/L — ABNORMAL HIGH (ref 0–44)
AST: 34 IU/L (ref 0–40)
Albumin: 4.9 g/dL (ref 3.8–4.9)
Alkaline Phosphatase: 67 IU/L (ref 44–121)
Bilirubin Total: 0.6 mg/dL (ref 0.0–1.2)
Bilirubin, Direct: 0.18 mg/dL (ref 0.00–0.40)
Total Protein: 7.7 g/dL (ref 6.0–8.5)

## 2020-04-08 LAB — BASIC METABOLIC PANEL
BUN/Creatinine Ratio: 11 (ref 9–20)
BUN: 16 mg/dL (ref 6–24)
CO2: 23 mmol/L (ref 20–29)
Calcium: 10.4 mg/dL — ABNORMAL HIGH (ref 8.7–10.2)
Chloride: 99 mmol/L (ref 96–106)
Creatinine, Ser: 1.45 mg/dL — ABNORMAL HIGH (ref 0.76–1.27)
Glucose: 108 mg/dL — ABNORMAL HIGH (ref 65–99)
Potassium: 4.8 mmol/L (ref 3.5–5.2)
Sodium: 140 mmol/L (ref 134–144)
eGFR: 56 mL/min/{1.73_m2} — ABNORMAL LOW (ref >59)

## 2020-04-10 ENCOUNTER — Telehealth: Payer: Self-pay | Admitting: Cardiology

## 2020-04-10 MED ORDER — ROSUVASTATIN CALCIUM 10 MG PO TABS: 10.0000 mg | ORAL_TABLET | Freq: Every day | ORAL | 6 refills | Status: DC

## 2020-04-10 NOTE — Telephone Encounter (Signed)
Pt c/o medication issue:  1. Name of Medication: omega-3 acid ethyl esters (LOVAZA) 1 g capsule  2. How are you currently taking this medication (dosage and times per day)?  2 capsules by mouth 2 (two) times daily  3. Are you having a reaction (difficulty breathing--STAT)? No   4. What is your medication issue?   Patient would like to know if he needs to continue taking this medication since he is about to start Crestor. Please advise.

## 2020-04-10 NOTE — Addendum Note (Signed)
Addended by: Eleonore Chiquito on: 04/10/2020 09:47 AM   Modules accepted: Orders

## 2020-04-10 NOTE — Telephone Encounter (Signed)
Recommendations reviewed with pt as per Dr. Revankar's note.  Pt verbalized understanding and had no additional questions.   

## 2020-04-18 ENCOUNTER — Ambulatory Visit: Payer: 59 | Admitting: Neurology

## 2020-05-03 ENCOUNTER — Ambulatory Visit: Payer: 59 | Admitting: Endocrinology

## 2020-05-04 ENCOUNTER — Ambulatory Visit: Payer: Self-pay | Admitting: Neurology

## 2020-05-12 LAB — HEPATIC FUNCTION PANEL
ALT: 43 IU/L (ref 0–44)
AST: 28 IU/L (ref 0–40)
Albumin: 4.8 g/dL (ref 3.8–4.9)
Alkaline Phosphatase: 62 IU/L (ref 44–121)
Bilirubin Total: 0.6 mg/dL (ref 0.0–1.2)
Bilirubin, Direct: 0.19 mg/dL (ref 0.00–0.40)
Total Protein: 7.2 g/dL (ref 6.0–8.5)

## 2020-05-12 LAB — LIPID PANEL
Chol/HDL Ratio: 4.1 ratio (ref 0.0–5.0)
Cholesterol, Total: 131 mg/dL (ref 100–199)
HDL: 32 mg/dL — ABNORMAL LOW (ref >39)
LDL Chol Calc (NIH): 69 mg/dL (ref 0–99)
Triglycerides: 179 mg/dL — ABNORMAL HIGH (ref 0–149)
VLDL Cholesterol Cal: 30 mg/dL (ref 5–40)

## 2020-06-07 ENCOUNTER — Ambulatory Visit (INDEPENDENT_AMBULATORY_CARE_PROVIDER_SITE_OTHER): Payer: 59 | Admitting: Endocrinology

## 2020-06-07 ENCOUNTER — Other Ambulatory Visit: Payer: Self-pay

## 2020-06-07 VITALS — BP 144/86 | HR 77 | Ht 65.0 in | Wt 184.2 lb

## 2020-06-07 DIAGNOSIS — E119 Type 2 diabetes mellitus without complications: Secondary | ICD-10-CM | POA: Diagnosis not present

## 2020-06-07 HISTORY — DX: Hypercalcemia: E83.52

## 2020-06-07 LAB — POCT GLYCOSYLATED HEMOGLOBIN (HGB A1C): Hemoglobin A1C: 5.9 % — AB (ref 4.0–5.6)

## 2020-06-07 LAB — VITAMIN D 25 HYDROXY (VIT D DEFICIENCY, FRACTURES): VITD: 43.57 ng/mL (ref 30.00–100.00)

## 2020-06-07 MED ORDER — SITAGLIPTIN PHOSPHATE 50 MG PO TABS: 50.0000 mg | ORAL_TABLET | Freq: Every day | ORAL | 3 refills | Status: DC

## 2020-06-07 NOTE — Progress Notes (Signed)
Subjective:    Patient ID: David Gill, male    DOB: 27-Jul-1960, 60 y.o.   MRN: [id number]  HPI pt is referred by Dr Phillips Odor, for diabetes.  Pt states DM was dx'ed 1/22; he is unaware of any chronic complicated by PN and stage 3 CRI; he has never been on insulin; pt says his diet is improved recently, but exercise is limited by health probs; he has never had pancreatitis, pancreatic surgery, severe hypoglycemia or DKA.  He was rx'ed Janumet.  He brings his meter with his cbg's which I have reviewed today.  cbg varies from 71-133.  He has lost 12 lbs, x a few mos.   Pt was found to have hypercalcemia in 3/22.  He does not take Vit-D.   Past Medical History:  Diagnosis Date  . Abnormal gait 03/21/2020  . Adjustment disorder with emotional disturbance 08/13/2019   Formatting of this note might be different from the original. 2021  . Allergic rhinitis 10/14/2019  . Anxiety 10/23/2019  . Atrophic kidney 10/24/2019   Formatting of this note might be different from the original. 2021:  CT right hydronephrosis  . Back pain   . Body mass index (BMI) 32.0-32.9, adult 02/10/2020  . Diabetes mellitus without complication (HCC)   . Dizziness 10/23/2019  . Essential hypertension 10/23/2019  . Exotropia, left eye 09/13/2015  . Generalized abdominal pain 11/23/2019   Formatting of this note might be different from the original. 11/23/2019:  . History of tobacco abuse   . Hyperglycemia   . Hypertension   . Hypertriglyceridemia 04/07/2020  . Hypokalemia 10/23/2019  . Insomnia   . Left lumbar radiculitis 02/23/2019   Formatting of this note might be different from the original. 2021: MRI DDD with impingement  . Low testosterone level in male   . Macular scar of both eyes 09/13/2015  . Metabolic acidosis with increased anion gap and accumulation of organic acids 10/23/2019  . Osteoarthritis 10/23/2019  . SIRS (systemic inflammatory response syndrome) (HCC) 10/23/2019  . Tachycardia 10/23/2019    Past  Surgical History:  Procedure Laterality Date  . TONSILLECTOMY      Social History   Socioeconomic History  . Marital status: Married    Spouse name: Not on file  . Number of children: Not on file  . Years of education: Not on file  . Highest education level: Not on file  Occupational History  . Not on file  Tobacco Use  . Smoking status: Former Smoker    Types: Cigarettes  . Smokeless tobacco: Never Used  Substance and Sexual Activity  . Alcohol use: Yes    Comment: seldom  . Drug use: Never  . Sexual activity: Yes  Other Topics Concern  . Not on file  Social History Narrative   Left handed   Social Determinants of Health   Financial Resource Strain: Not on file  Food Insecurity: Not on file  Transportation Needs: Not on file  Physical Activity: Not on file  Stress: Not on file  Social Connections: Not on file  Intimate Partner Violence: Not on file    Current Outpatient Medications on File Prior to Visit  Medication Sig Dispense Refill  . acetaminophen (TYLENOL) 500 MG tablet Take 500 mg by mouth every 6 (six) hours as needed for moderate pain.    Marland Kitchen amLODipine (NORVASC) 5 MG tablet Take 5 mg by mouth daily.    . cetirizine (ZYRTEC) 10 MG tablet Take 10 mg by mouth daily.    Marland Kitchen  fenofibrate 160 MG tablet Take 160 mg by mouth daily.    . hydrOXYzine (ATARAX/VISTARIL) 25 MG tablet Take 1 tablet (25 mg total) by mouth 3 (three) times daily as needed for up to 30 doses for anxiety. 30 tablet 0  . metoprolol succinate (TOPROL-XL) 100 MG 24 hr tablet Take 50 mg by mouth daily. Take with or immediately following a meal.    . omega-3 acid ethyl esters (LOVAZA) 1 g capsule Take 2 capsules by mouth 2 (two) times daily.    . rosuvastatin (CRESTOR) 10 MG tablet Take 1 tablet (10 mg total) by mouth daily. 30 tablet 6  . triamcinolone cream (KENALOG) 0.1 % Apply topically 2 (two) times daily.    Marland Kitchen zolpidem (AMBIEN CR) 12.5 MG CR tablet Take 12.5 mg by mouth at bedtime as needed for  sleep.     No current facility-administered medications on file prior to visit.    Allergies  Allergen Reactions  . Sulfamethoxazole Hives  . Triamcinolone Acetonide Hives    Family History  Problem Relation Age of Onset  . Colon cancer Mother   . Heart attack Mother   . CAD Mother   . Hypertension Father   . Diabetes Sister   . Diabetes Brother   . Breast cancer Maternal Grandmother   . Stroke Maternal Grandfather   . Diabetes Paternal Grandmother   . CAD Paternal Grandfather     BP (!) 144/86 (BP Location: Right Arm, Patient Position: Sitting, Cuff Size: Normal)   Pulse 77   Ht 5\' 5"  (1.651 m)   Wt 184 lb 3.2 oz (83.6 kg)   SpO2 98%   BMI 30.65 kg/m    Review of Systems denies sob, n/v.  No change in chronic anxiety/depression.     Objective:   Physical Exam VITAL SIGNS:  See vs page GENERAL: no distress Pulses: dorsalis pedis intact bilat.   MSK: no deformity of the feet CV: no leg edema Skin:  no ulcer on the feet.  normal color and temp on the feet. Neuro: sensation is intact to touch on the feet.    Lab Results  Component Value Date   HGBA1C 5.9 (A) 06/07/2020   Lab Results  Component Value Date   CREATININE 1.45 (H) 04/07/2020   BUN 16 04/07/2020   NA 140 04/07/2020   K 4.8 04/07/2020   CL 99 04/07/2020   CO2 23 04/07/2020   Lab Results  Component Value Date   CALCIUM 10.4 (H) 04/07/2020   Lab Results  Component Value Date   WBC 12.1 (H) 03/23/2020   HGB 17.1 (H) 03/23/2020   HCT 49.4 03/23/2020   MCV 83.6 03/23/2020   PLT 423 (H) 03/23/2020   TG=1696 TSH=1.2  I have reviewed outside records, and summarized:  Pt was noted to have DM, and referred here.  He was admitted in 2021 with SIRS and lactic acidosis.  No cause was found.      Assessment & Plan:  Type 2 DM: uncontrolled Lactic acidosis, recent, uncertain etiology and prognosis.  He should avoid metformin Hypertriglyceridemia: recheck when glycemic control is better.      Patient Instructions  Your blood pressure is high today.  Please see your primary care provider soon, to have it rechecked.   good diet and exercise significantly improve the control of your diabetes.  please let me know if you wish to be referred to a dietician.  high blood sugar is very risky to your health.  you should  see an eye doctor and dentist every year.  It is very important to get all recommended vaccinations.  Controlling your blood pressure and cholesterol drastically reduces the damage diabetes does to your body.  Those who smoke should quit.  Please discuss these with your doctor.  check your blood sugar once a day.  vary the time of day when you check, between before the 3 meals, and at bedtime.  also check if you have symptoms of your blood sugar being too high or too low.  please keep a record of the readings and bring it to your next appointment here (or you can bring the meter itself).  You can write it on any piece of paper.  please call us sooner if your blood sugar goes below 70, or if most of your readings are over 200. I have sent a prescription to your pharmacy, to change the Janumet to Januvia. Blood tests are requested for you today.  We'll let you know about the results.  Please come back for a follow-up appointment in 2 months.

## 2020-06-07 NOTE — Patient Instructions (Addendum)
Your blood pressure is high today.  Please see your primary care provider soon, to have it rechecked.   good diet and exercise significantly improve the control of your diabetes.  please let me know if you wish to be referred to a dietician.  high blood sugar is very risky to your health.  you should see an eye doctor and dentist every year.  It is very important to get all recommended vaccinations.  Controlling your blood pressure and cholesterol drastically reduces the damage diabetes does to your body.  Those who smoke should quit.  Please discuss these with your doctor.  check your blood sugar once a day.  vary the time of day when you check, between before the 3 meals, and at bedtime.  also check if you have symptoms of your blood sugar being too high or too low.  please keep a record of the readings and bring it to your next appointment here (or you can bring the meter itself).  You can write it on any piece of paper.  please call us sooner if your blood sugar goes below 70, or if most of your readings are over 200. I have sent a prescription to your pharmacy, to change the Janumet to Januvia. Blood tests are requested for you today.  We'll let you know about the results.  Please come back for a follow-up appointment in 2 months.

## 2020-06-08 LAB — PTH, INTACT AND CALCIUM
Calcium: 10.7 mg/dL — ABNORMAL HIGH (ref 8.6–10.3)
PTH: 22 pg/mL (ref 16–77)

## 2020-06-14 ENCOUNTER — Encounter: Payer: Self-pay | Admitting: Cardiology

## 2020-06-14 ENCOUNTER — Ambulatory Visit (INDEPENDENT_AMBULATORY_CARE_PROVIDER_SITE_OTHER): Payer: 59 | Admitting: Cardiology

## 2020-06-14 ENCOUNTER — Other Ambulatory Visit: Payer: Self-pay

## 2020-06-14 VITALS — BP 134/80 | HR 78 | Ht 65.0 in | Wt 185.4 lb

## 2020-06-14 DIAGNOSIS — E781 Pure hyperglyceridemia: Secondary | ICD-10-CM

## 2020-06-14 DIAGNOSIS — N289 Disorder of kidney and ureter, unspecified: Secondary | ICD-10-CM | POA: Insufficient documentation

## 2020-06-14 DIAGNOSIS — I10 Essential (primary) hypertension: Secondary | ICD-10-CM | POA: Diagnosis not present

## 2020-06-14 HISTORY — DX: Disorder of kidney and ureter, unspecified: N28.9

## 2020-06-14 MED ORDER — AMLODIPINE BESYLATE 10 MG PO TABS: 10.0000 mg | ORAL_TABLET | Freq: Every day | ORAL | 3 refills | Status: DC

## 2020-06-14 NOTE — Patient Instructions (Signed)
Medication Instructions:  Your physician has recommended you make the following change in your medication:   Increase your amlodipine to 10 mg daily.  *If you need a refill on your cardiac medications before your next appointment, please call your pharmacy*   Lab Work: None ordered If you have labs (blood work) drawn today and your tests are completely normal, you will receive your results only by: Marland Kitchen MyChart Message (if you have MyChart) OR . A paper copy in the mail If you have any lab test that is abnormal or we need to change your treatment, we will call you to review the results.   Testing/Procedures: None ordered   Follow-Up: At [facility], you and your health needs are our priority.  As part of our continuing mission to provide you with exceptional heart care, we have created designated Provider Care Teams.  These Care Teams include your primary Cardiologist (physician) and Advanced Practice Providers (APPs -  Physician Assistants and Nurse Practitioners) who all work together to provide you with the care you need, when you need it.  We recommend signing up for the patient portal called "MyChart".  Sign up information is provided on this After Visit Summary.  MyChart is used to connect with patients for Virtual Visits (Telemedicine).  Patients are able to view lab/test results, encounter notes, upcoming appointments, etc.  Non-urgent messages can be sent to your provider as well.   To learn more about what you can do with MyChart, go to [email or web address].    Your next appointment:   2 week(s)  The format for your next appointment:   In Person  Provider:   Belva Crome, MD   Other Instructions Blood Pressure Record Sheet To take your blood pressure, you will need a blood pressure machine. You can buy a blood pressure machine (blood pressure monitor) at your clinic, drug store, or online. When choosing one, consider:  An automatic monitor that has an arm  cuff.  A cuff that wraps snugly around your upper arm. You should be able to fit only one finger between your arm and the cuff.  A device that stores blood pressure reading results.  Do not choose a monitor that measures your blood pressure from your wrist or finger. Follow your health care provider's instructions for how to take your blood pressure. To use this form:  Get one reading in the morning (a.m.) 1-2 hours after you take any medicines.  Get one reading in the evening (p.m.) before supper.  Take at least 2 readings with each blood pressure check. This makes sure the results are correct. Wait 1-2 minutes between measurements.  Write down the results in the spaces on this form.  Repeat this once a week, or as told by your health care provider.    Make a follow-up appointment with your health care provider to discuss the results. Blood pressure log Date: _______________________  a.m. _____________________(1st reading) _____________________(2nd reading)  p.m. _____________________(1st reading) _____________________(2nd reading) Date: _______________________  a.m. _____________________(1st reading) _____________________(2nd reading)  p.m. _____________________(1st reading) _____________________(2nd reading) Date: _______________________  a.m. _____________________(1st reading) _____________________(2nd reading)  p.m. _____________________(1st reading) _____________________(2nd reading) Date: _______________________  a.m. _____________________(1st reading) _____________________(2nd reading)  p.m. _____________________(1st reading) _____________________(2nd reading) Date: _______________________  a.m. _____________________(1st reading) _____________________(2nd reading)  p.m. _____________________(1st reading) _____________________(2nd reading) This information is not intended to replace advice given to you by your health care provider. Make sure you discuss any  questions you have with your health care provider. Document Revised: 05/12/2019 Document  Reviewed: 05/12/2019 Elsevier Patient Education  2021 [employer].

## 2020-06-14 NOTE — Progress Notes (Signed)
Cardiology Office Note:    Date:  06/14/2020   ID:  David Gill, DOB 07/05/1960, MRN [id number]  PCP:  David Found, MD  Cardiologist:  David Brothers, MD   Referring MD: David Found, MD    ASSESSMENT:    1. Essential hypertension   2. Hypertriglyceridemia   3. Renal insufficiency    PLAN:    In order of problems listed above:  1. Primary prevention stressed with patient.  Importance of compliance with diet medication stressed any vocalized understanding.  I told him about aerobic exercises such as using a stationary bicycle half an hour a day and he promises to do so. 2. Essential hypertension: Blood pressure stable and diet was emphasized.  Lifestyle modification urged. 3. Mixed dyslipidemia: He has done well with diet and exercise and his lipids appear fine.  Again triglyceride reduction was suggested with diet. 4. Unsteady gait: Patient wants to stop beta-blocker and he is taking metoprolol 50 mg daily.  I told him to take 25 mg tomorrow and day after and then stop the medicine.Marland Kitchen  He will increase amlodipine to 10 mg daily.  He will keep a track of his blood pressures and see me in follow-up appointment in 2 weeks.  I hope it helps him with his gait.  He tells me that these issues started with initiation of beta-blocker. 5. Patient mentions to me that he has almost brought down his hemoglobin A1c to have the previous value with diet and exercise and I congratulated him.  He also has an appointment with his primary care and neurology for his unsteady gait issues. 6. Review of insufficiency: He has single functioning kidney and he is aware of it.  Caution advised.   Medication Adjustments/Labs and Tests Ordered: Current medicines are reviewed at length with the patient today.  Concerns regarding medicines are outlined above.  No orders of the defined types were placed in this encounter.  No orders of the defined types were placed in this encounter.    No chief  complaint on file.    History of Present Illness:    David Gill is a 60 y.o. male.  Patient has past medical history of essential hypertension and dyslipidemia.  He has significant renal insufficiency.  He has a single functioning kidney.  The patient mentions to me that he has had significant issues and a wobbly gait.  He has never had a fall.  This has helped him significantly disabled.  His wife accompanies him for this visit and agrees.  He wonders whether this started after initiation of a beta-blocker and wonders whether we can stop it.  Past Medical History:  Diagnosis Date  . Abnormal gait 03/21/2020  . Adjustment disorder with emotional disturbance 08/13/2019   Formatting of this note might be different from the original. 2021  . Allergic rhinitis 10/14/2019  . Anxiety 10/23/2019  . Atrophic kidney 10/24/2019   Formatting of this note might be different from the original. 2021:  CT right hydronephrosis  . Back pain   . Body mass index (BMI) 32.0-32.9, adult 02/10/2020  . Diabetes mellitus without complication (HCC)   . Dizziness 10/23/2019  . Essential hypertension 10/23/2019  . Exotropia, left eye 09/13/2015  . Generalized abdominal pain 11/23/2019   Formatting of this note might be different from the original. 11/23/2019:  . History of tobacco abuse   . Hyperglycemia   . Hypertension   . Hypertriglyceridemia 04/07/2020  . Hypokalemia 10/23/2019  . Insomnia   .  Left lumbar radiculitis 02/23/2019   Formatting of this note might be different from the original. 2021: MRI DDD with impingement  . Low testosterone level in male   . Macular scar of both eyes 09/13/2015  . Metabolic acidosis with increased anion gap and accumulation of organic acids 10/23/2019  . Osteoarthritis 10/23/2019  . SIRS (systemic inflammatory response syndrome) (HCC) 10/23/2019  . Tachycardia 10/23/2019    Past Surgical History:  Procedure Laterality Date  . TONSILLECTOMY      Current  Medications: Current Meds  Medication Sig  . acetaminophen (TYLENOL) 500 MG tablet Take 500 mg by mouth every 6 (six) hours as needed for moderate pain.  Marland Kitchen amLODipine (NORVASC) 5 MG tablet Take 5 mg by mouth daily.  . cetirizine (ZYRTEC) 10 MG tablet Take 10 mg by mouth daily.  . fenofibrate 160 MG tablet Take 160 mg by mouth daily.  . hydrochlorothiazide (HYDRODIURIL) 12.5 MG tablet Take 12.5 mg by mouth daily.  . hydrOXYzine (ATARAX/VISTARIL) 25 MG tablet Take 1 tablet (25 mg total) by mouth 3 (three) times daily as needed for up to 30 doses for anxiety.  . metoprolol succinate (TOPROL-XL) 100 MG 24 hr tablet Take 50 mg by mouth daily. Take with or immediately following a meal.  . omega-3 acid ethyl esters (LOVAZA) 1 g capsule Take 2 capsules by mouth 2 (two) times daily.  . rosuvastatin (CRESTOR) 10 MG tablet Take 1 tablet (10 mg total) by mouth daily.  . sitaGLIPtin (JANUVIA) 50 MG tablet Take 1 tablet (50 mg total) by mouth daily.  Marland Kitchen triamcinolone cream (KENALOG) 0.1 % Apply 1 application topically as needed (rash).  . zolpidem (AMBIEN CR) 12.5 MG CR tablet Take 12.5 mg by mouth at bedtime as needed for sleep.     Allergies:   Sulfamethoxazole and Triamcinolone acetonide   Social History   Socioeconomic History  . Marital status: Married    Spouse name: Not on file  . Number of children: Not on file  . Years of education: Not on file  . Highest education level: Not on file  Occupational History  . Not on file  Tobacco Use  . Smoking status: Former Smoker    Types: Cigarettes  . Smokeless tobacco: Never Used  Substance and Sexual Activity  . Alcohol use: Yes    Comment: seldom  . Drug use: Never  . Sexual activity: Yes  Other Topics Concern  . Not on file  Social History Narrative   Left handed   Social Determinants of Health   Financial Resource Strain: Not on file  Food Insecurity: Not on file  Transportation Needs: Not on file  Physical Activity: Not on file   Stress: Not on file  Social Connections: Not on file     Family History: The patient's family history includes Breast cancer in his maternal grandmother; CAD in his mother and paternal grandfather; Colon cancer in his mother; Diabetes in his brother, paternal grandmother, and sister; Heart attack in his mother; Hypertension in his father; Stroke in his maternal grandfather.  ROS:   Please see the history of present illness.    All other systems reviewed and are negative.  EKGs/Labs/Other Studies Reviewed:    The following studies were reviewed today: I discussed my findings with the patient including lipids   Recent Labs: 10/23/2019: TSH 1.305 10/25/2019: Magnesium 2.5 03/23/2020: Hemoglobin 17.1; Platelets 423 04/07/2020: BUN 16; Creatinine, Ser 1.45; Potassium 4.8; Sodium 140 05/12/2020: ALT 43  Recent Lipid Panel  Component Value Date/Time   CHOL 131 05/12/2020 0806   TRIG 179 (H) 05/12/2020 0806   HDL 32 (L) 05/12/2020 0806   CHOLHDL 4.1 05/12/2020 0806   LDLCALC 69 05/12/2020 0806    Physical Exam:    VS:  BP 134/80   Pulse 78   Ht 5\' 5"  (1.651 m)   Wt 185 lb 6.4 oz (84.1 kg)   SpO2 98%   BMI 30.85 kg/m     Wt Readings from Last 3 Encounters:  06/14/20 185 lb 6.4 oz (84.1 kg)  06/07/20 184 lb 3.2 oz (83.6 kg)  04/07/20 179 lb 12.8 oz (81.6 kg)     GEN: Patient is in no acute distress HEENT: Normal NECK: No JVD; No carotid bruits LYMPHATICS: No lymphadenopathy CARDIAC: Hear sounds regular, 2/6 systolic murmur at the apex. RESPIRATORY:  Clear to auscultation without rales, wheezing or rhonchi  ABDOMEN: Soft, non-tender, non-distended MUSCULOSKELETAL:  No edema; No deformity  SKIN: Warm and dry NEUROLOGIC:  Alert and oriented x 3 PSYCHIATRIC:  Normal affect   Signed, 06/07/20, MD  06/14/2020 12:47 PM    [facility]

## 2020-06-19 ENCOUNTER — Encounter: Payer: Self-pay | Admitting: Endocrinology

## 2020-06-20 ENCOUNTER — Other Ambulatory Visit: Payer: Self-pay | Admitting: Endocrinology

## 2020-06-20 MED ORDER — RYBELSUS 3 MG PO TABS: 3.0000 mg | ORAL_TABLET | Freq: Every day | ORAL | 11 refills | Status: DC

## 2020-06-21 NOTE — Progress Notes (Deleted)
NEUROLOGY FOLLOW UP OFFICE NOTE  David Gill [id number]  Assessment/Plan:   1.  Balance disorder 2.  Numbness and tingling  ***  Subjective:  David Gill is a 60 year old left-handed male with HTN who follows up for unsteady gait.  He is accompanied by his wife.  UPDATE: MRI of brain without contrast on 03/30/2020 personally reviewed showed minimal punctate T2/FLAIR foci in the cerebral white matter but otherwise unremarkable.  HISTORY: He had pneumonia in June 2021.  He then developed dizziness with palpitations and fluctuating blood pressure.  He was started on metoprolol and miratazapine.  He stopped the mirtazapine after a month due to side effects such as nightmares.  He was treated with Augmentin for possible tooth infection as he endorsed jaw pain.  CT sinuses showed carious posterior dentition including posterior right mandible molar with abnormal periapical lucency but no associated inflammation.  He was admitted to the hospital in September 2021 for his ongoing symptoms such as tachycardia, dizziness and anxiety.  TSH was 1.305.  He also had been experiencing abdominal pain.  He endorsed low back pain radiating into the left hip pain.  MRI of lumbar spine from October 2021 showed spondylosis most significant at L3-4 causing moderate bilateral neural foraminal narrowing as well as L4-5 and L5-S1 mild neural foraminal narrowing.  An MRI of the brain and cervical spine have been ordered but only the brain was approved.  He has not scheduled it yet.  The MRI of cervical spine was denied.  He says the dizziness isn't a spinning, lightheadedness or wooziness.  It occurs when standing and states he feels like he is standing on a boat, an undulating sensation.  No headache, nausea, new visual deficits.  He has had some intermittent neck pain and numbness in his arms.  No weakness.  It is persistent when standing.  No falls.  He had blood work performed last week which  reportedly showed a Hgb A1c of 11 with serum glucose 500.  He also had elevated cholesterol and his triglycerides were "off the charts".  He was born with retinal abnormalities with   PAST MEDICAL HISTORY: Past Medical History:  Diagnosis Date  . Abnormal gait 03/21/2020  . Adjustment disorder with emotional disturbance 08/13/2019   Formatting of this note might be different from the original. 2021  . Allergic rhinitis 10/14/2019  . Anxiety 10/23/2019  . Atrophic kidney 10/24/2019   Formatting of this note might be different from the original. 2021:  CT right hydronephrosis  . Back pain   . Body mass index (BMI) 32.0-32.9, adult 02/10/2020  . Diabetes mellitus without complication (HCC)   . Dizziness 10/23/2019  . Essential hypertension 10/23/2019  . Exotropia, left eye 09/13/2015  . Generalized abdominal pain 11/23/2019   Formatting of this note might be different from the original. 11/23/2019:  . History of tobacco abuse   . Hyperglycemia   . Hypertension   . Hypertriglyceridemia 04/07/2020  . Hypokalemia 10/23/2019  . Insomnia   . Left lumbar radiculitis 02/23/2019   Formatting of this note might be different from the original. 2021: MRI DDD with impingement  . Low testosterone level in male   . Macular scar of both eyes 09/13/2015  . Metabolic acidosis with increased anion gap and accumulation of organic acids 10/23/2019  . Osteoarthritis 10/23/2019  . SIRS (systemic inflammatory response syndrome) (HCC) 10/23/2019  . Tachycardia 10/23/2019    MEDICATIONS: Current Outpatient Medications on File Prior to Visit  Medication Sig Dispense Refill  . acetaminophen (TYLENOL) 500 MG tablet Take 500 mg by mouth every 6 (six) hours as needed for moderate pain.    Marland Kitchen amLODipine (NORVASC) 10 MG tablet Take 1 tablet (10 mg total) by mouth daily. 180 tablet 3  . cetirizine (ZYRTEC) 10 MG tablet Take 10 mg by mouth daily.    . fenofibrate 160 MG tablet Take 160 mg by mouth daily.    . hydrochlorothiazide  (HYDRODIURIL) 12.5 MG tablet Take 12.5 mg by mouth daily.    . hydrOXYzine (ATARAX/VISTARIL) 25 MG tablet Take 1 tablet (25 mg total) by mouth 3 (three) times daily as needed for up to 30 doses for anxiety. 30 tablet 0  . metoprolol succinate (TOPROL-XL) 100 MG 24 hr tablet Take 50 mg by mouth daily. Take with or immediately following a meal.    . omega-3 acid ethyl esters (LOVAZA) 1 g capsule Take 2 capsules by mouth 2 (two) times daily.    . rosuvastatin (CRESTOR) 10 MG tablet Take 1 tablet (10 mg total) by mouth daily. 30 tablet 6  . Semaglutide (RYBELSUS) 3 MG TABS Take 3 mg by mouth daily. 30 tablet 11  . sitaGLIPtin (JANUVIA) 50 MG tablet Take 1 tablet (50 mg total) by mouth daily. 90 tablet 3  . triamcinolone cream (KENALOG) 0.1 % Apply 1 application topically as needed (rash).    . zolpidem (AMBIEN CR) 12.5 MG CR tablet Take 12.5 mg by mouth at bedtime as needed for sleep.     No current facility-administered medications on file prior to visit.    ALLERGIES: Allergies  Allergen Reactions  . Sulfamethoxazole Hives  . Triamcinolone Acetonide Hives    FAMILY HISTORY: Family History  Problem Relation Age of Onset  . Colon cancer Mother   . Heart attack Mother   . CAD Mother   . Hypertension Father   . Diabetes Sister   . Diabetes Brother   . Breast cancer Maternal Grandmother   . Stroke Maternal Grandfather   . Diabetes Paternal Grandmother   . CAD Paternal Grandfather       Objective:  *** General: No acute distress.  Patient appears ***-groomed.   Head:  Normocephalic/atraumatic Eyes:  Fundi examined but not visualized Neck: supple, no paraspinal tenderness, full range of motion Heart:  Regular rate and rhythm Lungs:  Clear to auscultation bilaterally Back: No paraspinal tenderness Neurological Exam: alert and oriented to person, place, and time. Attention span and concentration intact, recent and remote memory intact, fund of knowledge intact.  Speech fluent and  not dysarthric, language intact.  CN II-XII intact. Bulk and tone normal, muscle strength 5/5 throughout.  Sensation to light touch, temperature and vibration intact.  Deep tendon reflexes 2+ throughout, toes downgoing.  Finger to nose and heel to shin testing intact.  Gait normal, Romberg negative.     Shon Millet, DO  CC: ***

## 2020-06-23 ENCOUNTER — Ambulatory Visit: Payer: 59 | Admitting: Neurology

## 2020-06-24 ENCOUNTER — Telehealth: Payer: 59 | Admitting: Physician Assistant

## 2020-06-24 DIAGNOSIS — U071 COVID-19: Secondary | ICD-10-CM

## 2020-06-24 MED ORDER — BENZONATATE 100 MG PO CAPS: 100.0000 mg | ORAL_CAPSULE | Freq: Three times a day (TID) | ORAL | 0 refills | Status: DC | PRN

## 2020-06-24 MED ORDER — FLUTICASONE PROPIONATE 50 MCG/ACT NA SUSP: 2.0000 | Freq: Every day | NASAL | 0 refills | Status: DC

## 2020-06-24 NOTE — Progress Notes (Signed)
E-Visit for Positive Covid Test Result We are sorry you are not feeling well. We are here to help!  You have tested positive for COVID-19, meaning that you were infected with the novel coronavirus and could give the virus to others.  It is vitally important that you stay home so you do not spread it to others.      Please continue isolation at home, for at least 10 days since the start of your symptoms and until you have had 24 hours with no fever (without taking a fever reducer) and with improving of symptoms.  If you have no symptoms but tested positive (or all symptoms resolve after 5 days and you have no fever) you can leave your house but continue to wear a mask around others for an additional 5 days. If you have a fever,continue to stay home until you have had 24 hours of no fever. Most cases improve 5-10 days from onset but we have seen a small number of patients who have gotten worse after the 10 days.  Please be sure to watch for worsening symptoms and remain taking the proper precautions.   Go to the nearest hospital ED for assessment if fever/cough/breathlessness are severe or illness seems like a threat to life.    The following symptoms may appear 2-14 days after exposure: . Fever . Cough . Shortness of breath or difficulty breathing . Chills . Repeated shaking with chills . Muscle pain . Headache . Sore throat . New loss of taste or smell . Fatigue . Congestion or runny nose . Nausea or vomiting . Diarrhea  You have been enrolled in [facility] Monitoring for COVID-19. Daily you will receive a questionnaire within the MyChart website. Our COVID-19 response team will be monitoring your responses daily.  You can use medication such as prescription cough medication called Tessalon Perles 100 mg. You may take 1-2 capsules every 8 hours as needed for cough and prescription for Fluticasone nasal spray 2 sprays in each nostril one time per day  You may also take acetaminophen  (Tylenol) as needed for fever.  You have also been referred to the Covid Treatment team associated with [facility] to see if you would be a candidate for the Monoclonal Antibody treatments. If so, these infusions are completed on M-W-F at [facility].   HOME CARE: . Only take medications as instructed by your medical team. . Drink plenty of fluids and get plenty of rest. . A steam or ultrasonic humidifier can help if you have congestion.   GET HELP RIGHT AWAY IF YOU HAVE EMERGENCY WARNING SIGNS.  Call 911 or proceed to your closest emergency facility if: . You develop worsening high fever. . Trouble breathing . Bluish lips or face . Persistent pain or pressure in the chest . New confusion . Inability to wake or stay awake . You cough up blood. . Your symptoms become more severe . Inability to hold down food or fluids  This list is not all possible symptoms. Contact your medical provider for any symptoms that are severe or concerning to you.    Your e-visit answers were reviewed by a board certified advanced clinical practitioner to complete your personal care plan.  Depending on the condition, your plan could have included both over the counter or prescription medications.  If there is a problem please reply once you have received a response from your provider.  Your safety is important to [place].  If you have drug allergies  check your prescription carefully.    You can use MyChart to ask questions about today's visit, request a non-urgent call back, or ask for a work or school excuse for 24 hours related to this e-Visit. If it has been greater than 24 hours you will need to follow up with your provider, or enter a new e-Visit to address those concerns. You will get an e-mail in the next two days asking about your experience.  I hope that your e-visit has been valuable and will speed your recovery. Thank you for using e-visits.  I provided 5 minutes of non face-to-face time  during this encounter for chart review and documentation.

## 2020-06-25 ENCOUNTER — Other Ambulatory Visit: Payer: Self-pay | Admitting: Nurse Practitioner

## 2020-06-25 ENCOUNTER — Encounter (INDEPENDENT_AMBULATORY_CARE_PROVIDER_SITE_OTHER): Payer: Self-pay

## 2020-06-25 DIAGNOSIS — E119 Type 2 diabetes mellitus without complications: Secondary | ICD-10-CM

## 2020-06-25 DIAGNOSIS — U071 COVID-19: Secondary | ICD-10-CM

## 2020-06-25 NOTE — Progress Notes (Signed)
I connected by phone with David Gill on 06/25/2020 at 1:09 PM to discuss the potential use of a new treatment for mild to moderate COVID-19 viral infection in non-hospitalized patients.  This patient is a 60 y.o. male that meets the FDA criteria for Emergency Use Authorization of COVID monoclonal antibody bebtelovimab.  Has a (+) direct SARS-CoV-2 viral test result  Has mild or moderate COVID-19   Is NOT hospitalized due to COVID-19  Is within 10 days of symptom onset  Has at least one of the high risk factor(s) for progression to severe COVID-19 and/or hospitalization as defined in EUA.  Specific high risk criteria : BMI > 25, Chronic Kidney Disease (CKD), Diabetes and Cardiovascular disease or hypertension   I have spoken and communicated the following to the patient or parent/caregiver regarding COVID monoclonal antibody treatment:  1. FDA has authorized the emergency use for the treatment of mild to moderate COVID-19 in adults and pediatric patients with positive results of direct SARS-CoV-2 viral testing who are 38 years of age and older weighing at least 40 kg, and who are at high risk for progressing to severe COVID-19 and/or hospitalization.  2. The significant known and potential risks and benefits of COVID monoclonal antibody, and the extent to which such potential risks and benefits are unknown.  3. Information on available alternative treatments and the risks and benefits of those alternatives, including clinical trials.  4. Patients treated with COVID monoclonal antibody should continue to self-isolate and use infection control measures (e.g., wear mask, isolate, social distance, avoid sharing personal items, clean and disinfect "high touch" surfaces, and frequent handwashing) according to CDC guidelines.   5. The patient or parent/caregiver has the option to accept or refuse COVID monoclonal antibody treatment.  6. Discussion about the monoclonal antibody infusion  does not ensure treatment. The patient will be placed on a list and scheduled according to risk, symptom onset and availability. A scheduler will reach to the patient to let them know if we can accommodate their infusion or not.  After reviewing this information with the patient, the patient has agreed to receive one of the available covid 19 monoclonal antibodies and will be provided an appropriate fact sheet prior to infusion. Mayra Reel, NP 06/25/2020 1:09 PM

## 2020-06-26 ENCOUNTER — Ambulatory Visit (INDEPENDENT_AMBULATORY_CARE_PROVIDER_SITE_OTHER): Payer: 59 | Admitting: *Deleted

## 2020-06-26 DIAGNOSIS — U071 COVID-19: Secondary | ICD-10-CM

## 2020-06-26 DIAGNOSIS — E119 Type 2 diabetes mellitus without complications: Secondary | ICD-10-CM

## 2020-06-26 MED ORDER — EPINEPHRINE 0.3 MG/0.3ML IJ SOAJ: 0.3000 mg | Freq: Once | INTRAMUSCULAR | Status: AC | PRN

## 2020-06-26 MED ORDER — METHYLPREDNISOLONE SODIUM SUCC 125 MG IJ SOLR: 125.0000 mg | Freq: Once | INTRAMUSCULAR | Status: AC | PRN

## 2020-06-26 MED ORDER — SODIUM CHLORIDE 0.9 % IV SOLN: INTRAVENOUS | Status: DC | PRN

## 2020-06-26 MED ORDER — DIPHENHYDRAMINE HCL 50 MG/ML IJ SOLN
50.0000 mg | Freq: Once | INTRAMUSCULAR | Status: AC | PRN
Start: 2020-06-26 — End: 2020-06-26

## 2020-06-26 MED ORDER — ALBUTEROL SULFATE HFA 108 (90 BASE) MCG/ACT IN AERS: 2.0000 | INHALATION_SPRAY | Freq: Once | RESPIRATORY_TRACT | Status: AC | PRN

## 2020-06-26 MED ORDER — FAMOTIDINE IN NACL 20-0.9 MG/50ML-% IV SOLN: 20.0000 mg | Freq: Once | INTRAVENOUS | Status: AC | PRN

## 2020-06-26 MED ORDER — BEBTELOVIMAB 175 MG/2 ML IV (EUA)
175.0000 mg | Freq: Once | INTRAMUSCULAR | Status: AC
  Administered 2020-06-26: 175 mg via INTRAVENOUS

## 2020-06-26 NOTE — Progress Notes (Signed)
Diagnosis: COVID  Provider:  Chilton Greathouse, MD  Procedure: Infusion  IV Type: Peripheral, IV Location: R Hand  Bebtelovimab, Dose: 175 mg  Infusion Start Time: 1349  Infusion Stop Time: 1420  Post Infusion IV Care: Observation period completed and Peripheral IV Discontinued  Discharge: Condition: Good, Destination: Home . AVS provided to patient.   Performed by:  Forrest Moron, RN

## 2020-06-26 NOTE — Patient Instructions (Addendum)
10 Things You Can Do to Manage Your COVID-19 Symptoms at Home If you have possible or confirmed COVID-19: 1. Stay home except to get medical care. 2. Monitor your symptoms carefully. If your symptoms get worse, call your healthcare provider immediately. 3. Get rest and stay hydrated. 4. If you have a medical appointment, call the healthcare provider ahead of time and tell them that you have or may have COVID-19. 5. For medical emergencies, call 911 and notify the dispatch personnel that you have or may have COVID-19. 6. Cover your cough and sneezes with a tissue or use the inside of your elbow. 7. Wash your hands often with soap and water for at least 20 seconds or clean your hands with an alcohol-based hand sanitizer that contains at least 60% alcohol. 8. As much as possible, stay in a specific room and away from other people in your home. Also, you should use a separate bathroom, if available. If you need to be around other people in or outside of the home, wear a mask. 9. Avoid sharing personal items with other people in your household, like dishes, towels, and bedding. 10. Clean all surfaces that are touched often, like counters, tabletops, and doorknobs. Use household cleaning sprays or wipes according to the label instructions. cdc.gov/coronavirus 08/20/2019 This information is not intended to replace advice given to you by your health care provider. Make sure you discuss any questions you have with your health care provider. Document Revised: 12/06/2019 Document Reviewed: 12/06/2019 Elsevier Patient Education  2021 Elsevier Inc.  What types of side effects do monoclonal antibody drugs cause?  Common side effects  In general, the more common side effects caused by monoclonal antibody drugs include: . Allergic reactions, such as hives or itching . Flu-like signs and symptoms, including chills, fatigue, fever, and muscle aches and pains . Nausea, vomiting . Diarrhea . Skin  rashes . Low blood pressure   The CDC is recommending patients who receive monoclonal antibody treatments wait at least 90 days before being vaccinated.  Currently, there are no data on the safety and efficacy of mRNA COVID-19 vaccines in persons who received monoclonal antibodies or convalescent plasma as part of COVID-19 treatment. Based on the estimated half-life of such therapies as well as evidence suggesting that reinfection is uncommon in the 90 days after initial infection, vaccination should be deferred for at least 90 days, as a precautionary measure until additional information becomes available, to avoid interference of the antibody treatment with vaccine-induced immune responses.  

## 2020-06-28 ENCOUNTER — Ambulatory Visit: Payer: 59 | Admitting: Cardiology

## 2020-06-30 ENCOUNTER — Encounter (INDEPENDENT_AMBULATORY_CARE_PROVIDER_SITE_OTHER): Payer: Self-pay

## 2020-07-06 ENCOUNTER — Other Ambulatory Visit (HOSPITAL_COMMUNITY): Payer: Self-pay | Admitting: Physician Assistant

## 2020-07-06 ENCOUNTER — Ambulatory Visit (HOSPITAL_COMMUNITY)
Admission: RE | Admit: 2020-07-06 | Discharge: 2020-07-06 | Disposition: A | Discharge status: Home / Self Care | Payer: 59 | Source: Ambulatory Visit | Attending: Physician Assistant | Admitting: Physician Assistant

## 2020-07-06 ENCOUNTER — Other Ambulatory Visit: Payer: Self-pay | Admitting: Physician Assistant

## 2020-07-06 DIAGNOSIS — M25471 Effusion, right ankle: Secondary | ICD-10-CM | POA: Diagnosis present

## 2020-07-06 DIAGNOSIS — M25472 Effusion, left ankle: Secondary | ICD-10-CM

## 2020-07-06 DIAGNOSIS — M79661 Pain in right lower leg: Secondary | ICD-10-CM

## 2020-07-06 IMAGING — DX DG ANKLE COMPLETE 3+V*R*
3 series · 3 of 3 positions shown · non-contrast
Comparison: None.

CLINICAL DATA: Right ankle pain. Rolled ankle while getting out of
bed

EXAM:
RIGHT ANKLE - COMPLETE 3+ VIEW

[ankle ap]
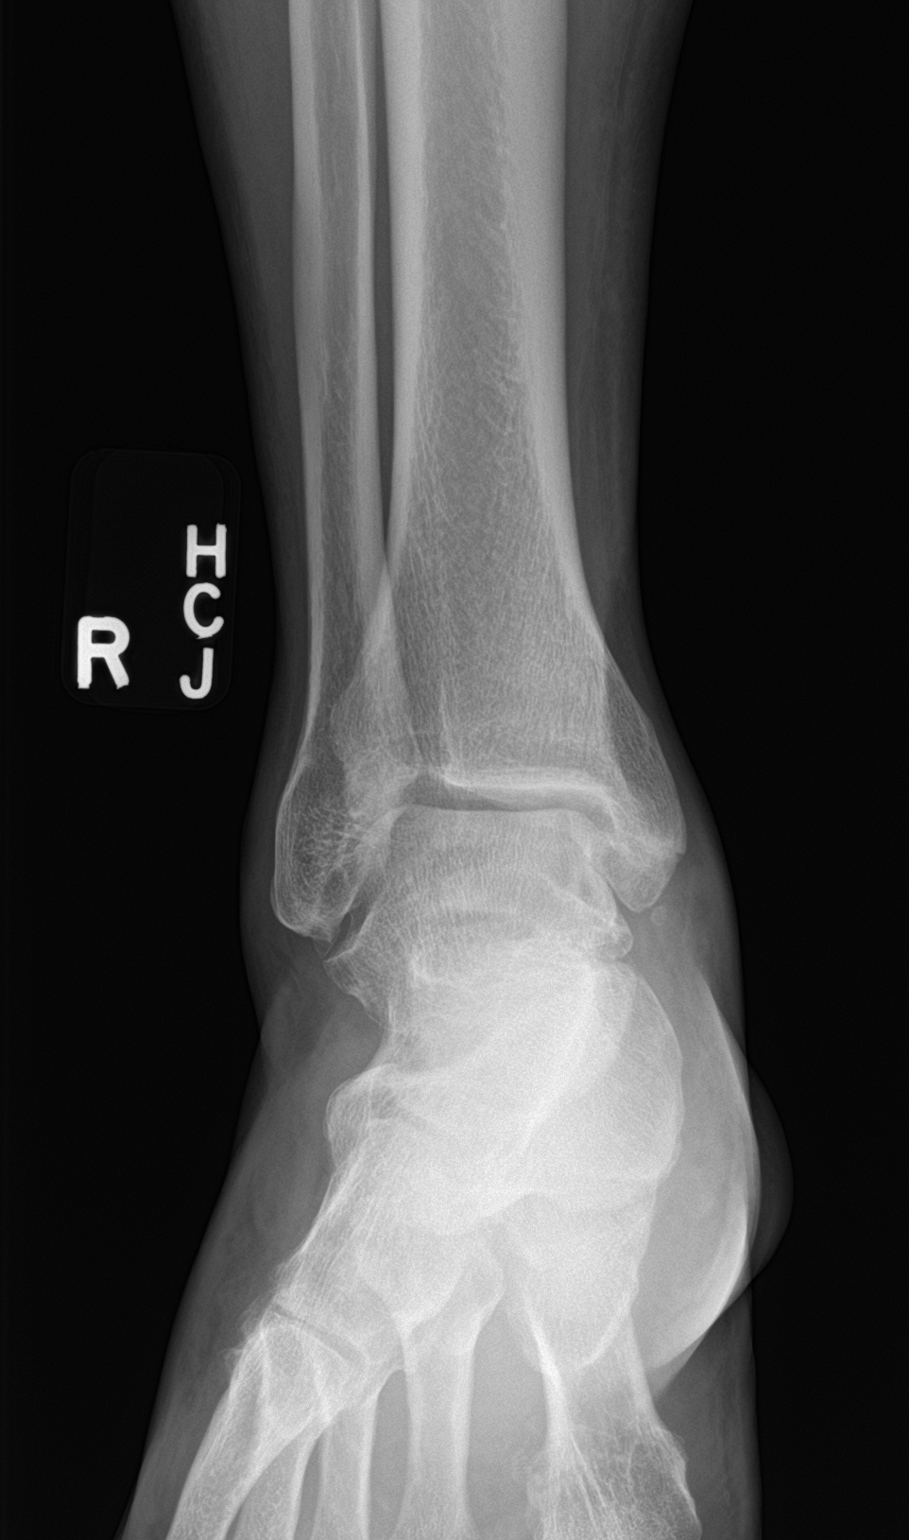

[ankle obl]
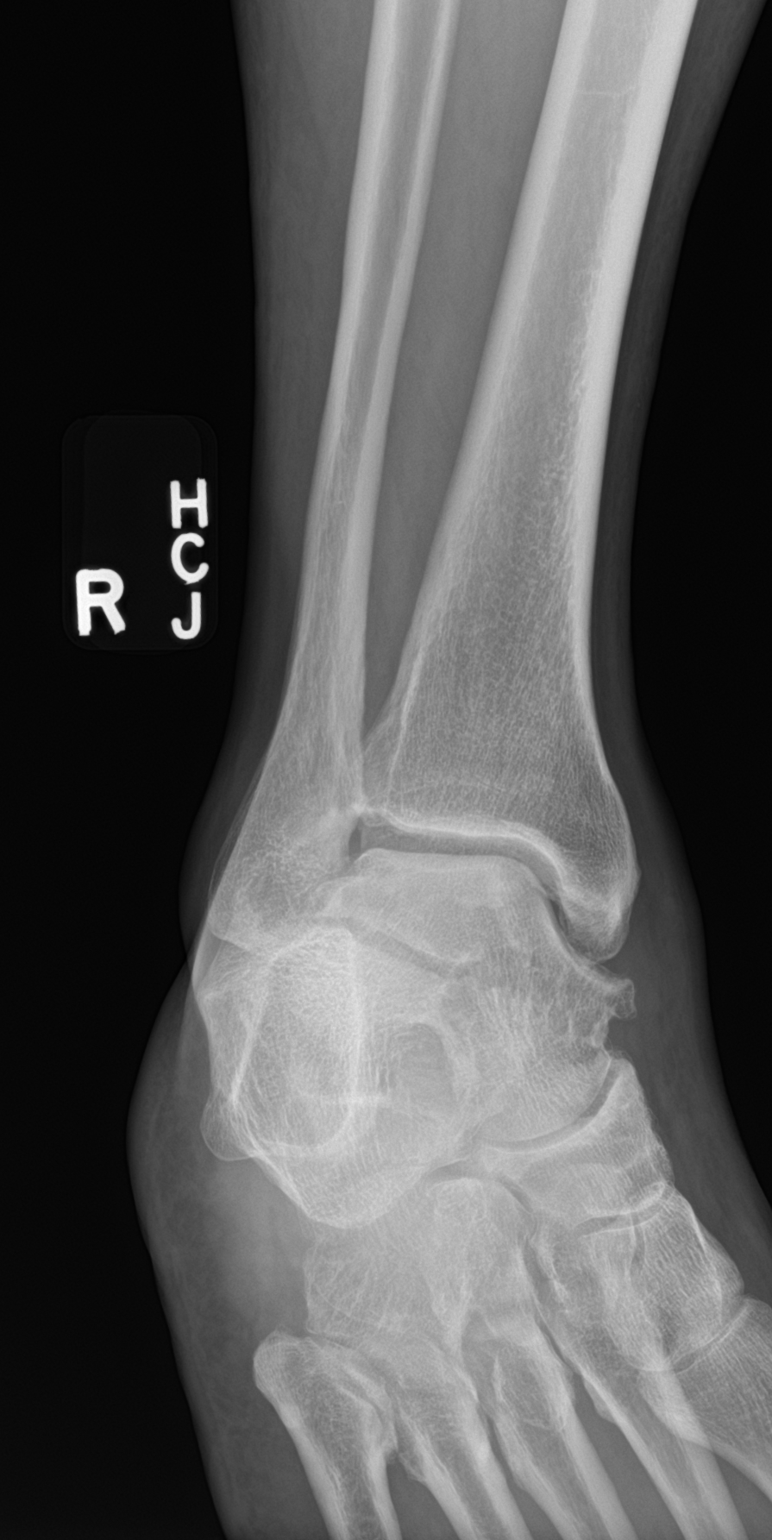

[ankle lat]
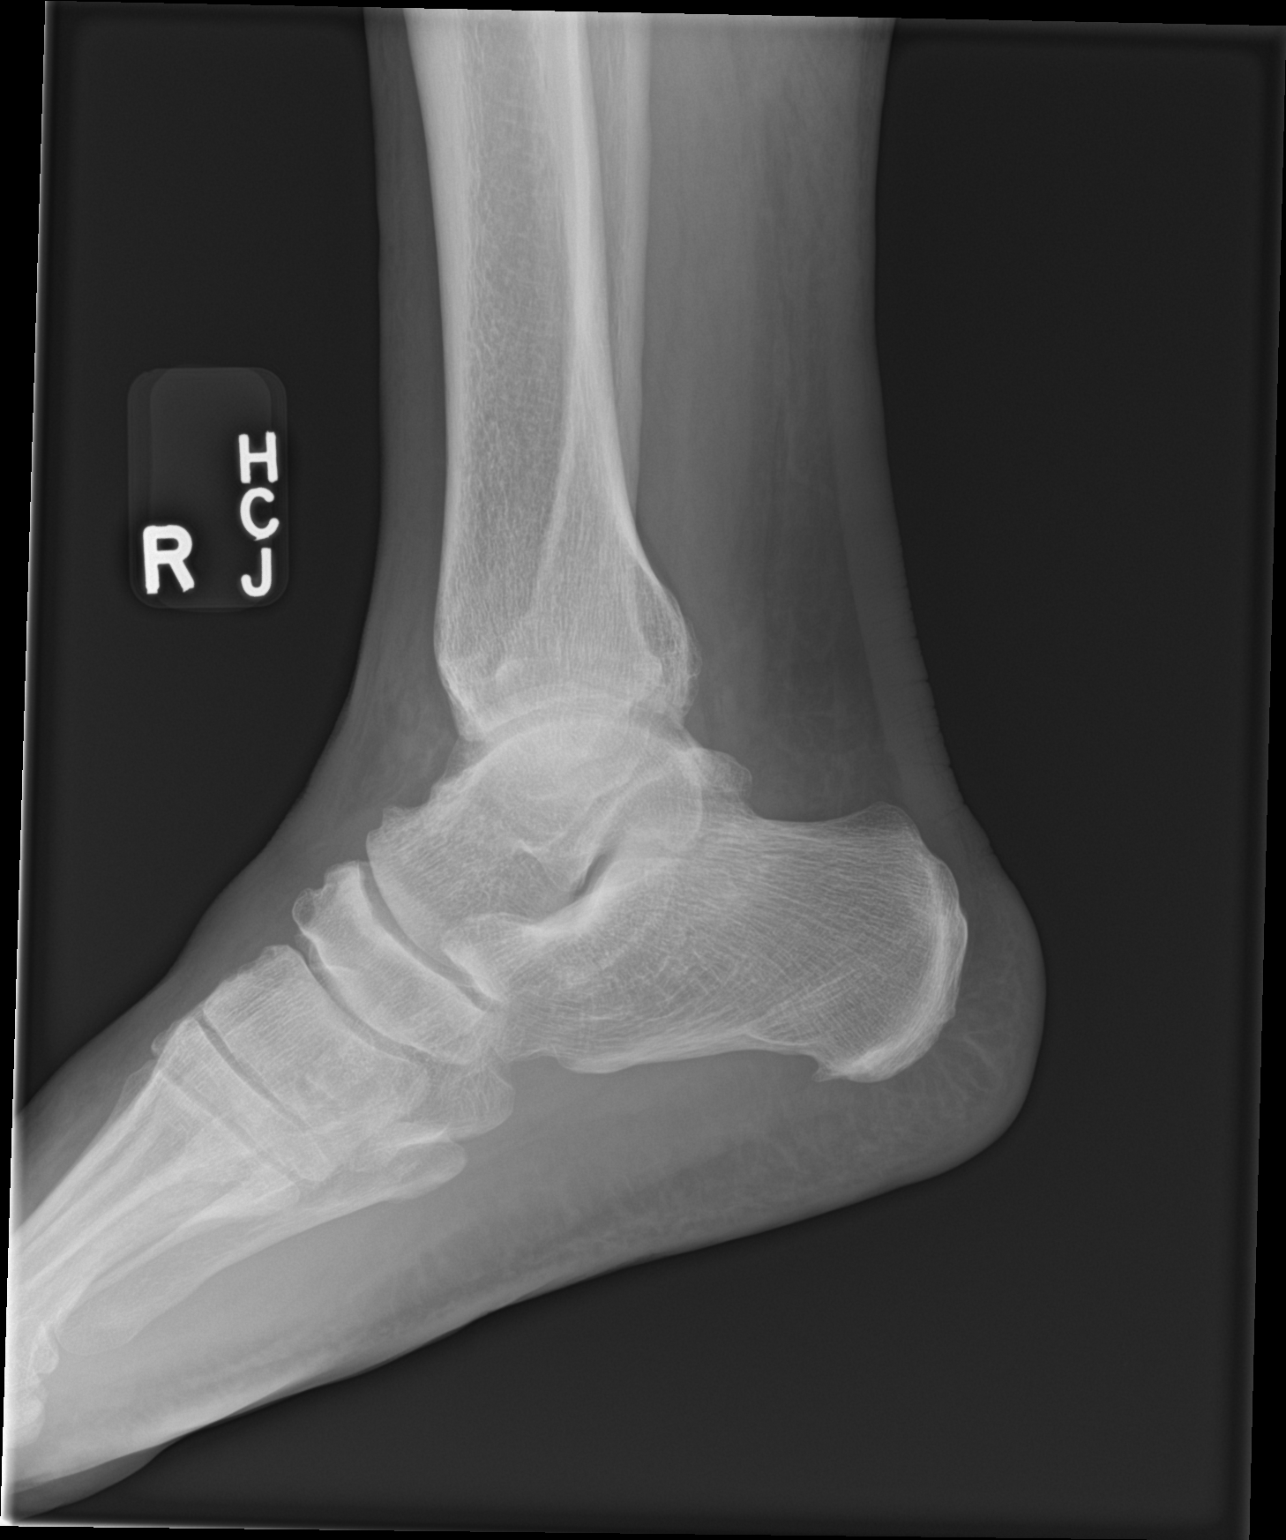

[3 of 3 positions shown; findings below may reference images not displayed]

FINDINGS: Mild soft tissue edema surrounding the ankle. No evidence of
fracture, malalignment or joint effusion. Very mild midfoot and
tibiotalar degenerative changes.
IMPRESSION: Mild soft tissue swelling without evidence of acute fracture,
malalignment or joint effusion.

## 2020-07-06 IMAGING — US US EXTREM LOW VENOUS*R*
1 series · 13 of 24 positions shown · non-contrast
Comparison: None.

CLINICAL DATA: Right lower extremity pain for the past 3 days

EXAM:
RIGHT LOWER EXTREMITY VENOUS DOPPLER ULTRASOUND
TECHNIQUE: Gray-scale sonography with compression, as well as color and duplex
ultrasound, were performed to evaluate the deep venous system(s)
from the level of the common femoral vein through the popliteal and
proximal calf veins.

[Series 1: us extrem low venous*right* · 0.07mm/px · 13 of 46 slices shown]
[im 1/46]
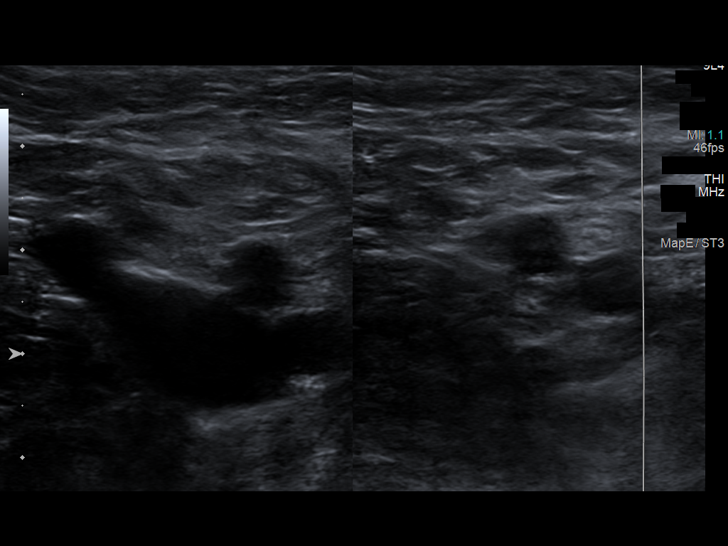
[im 4/46]
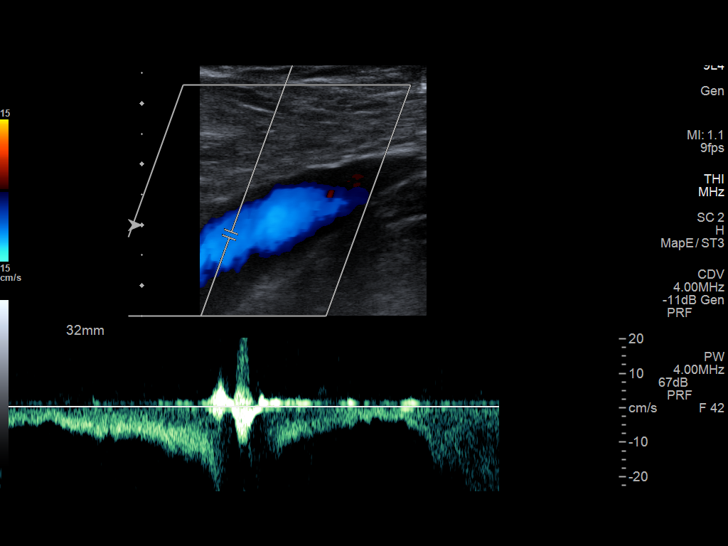
[im 8/46]
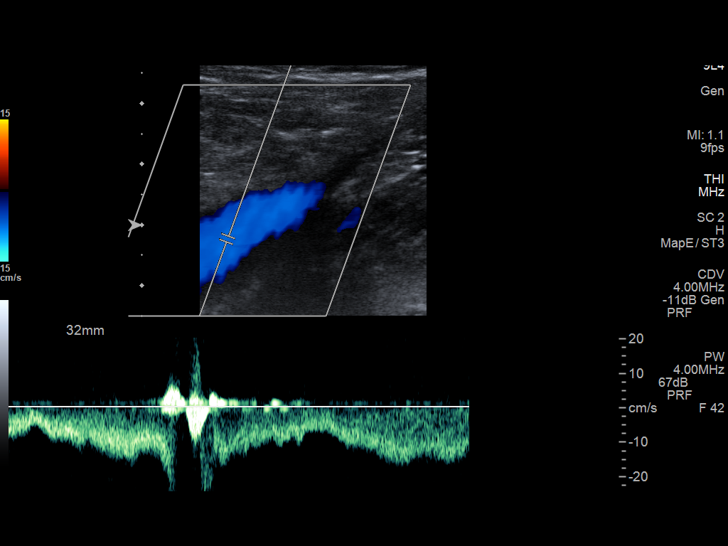
[im 12/46]
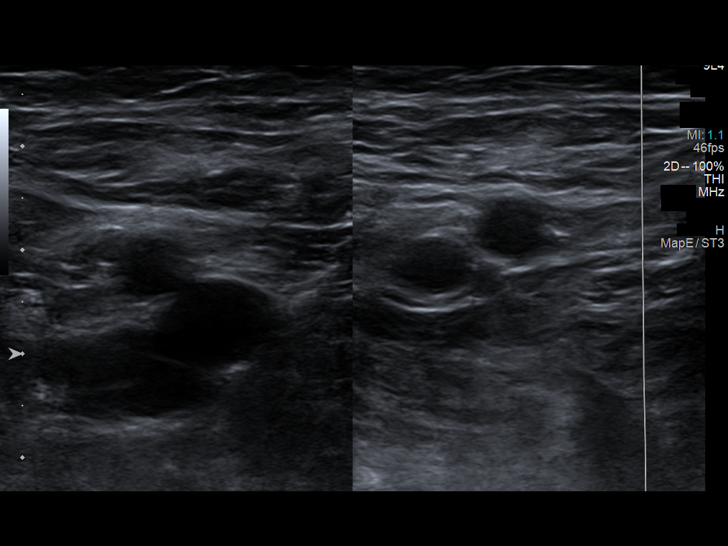
[im 16/46]
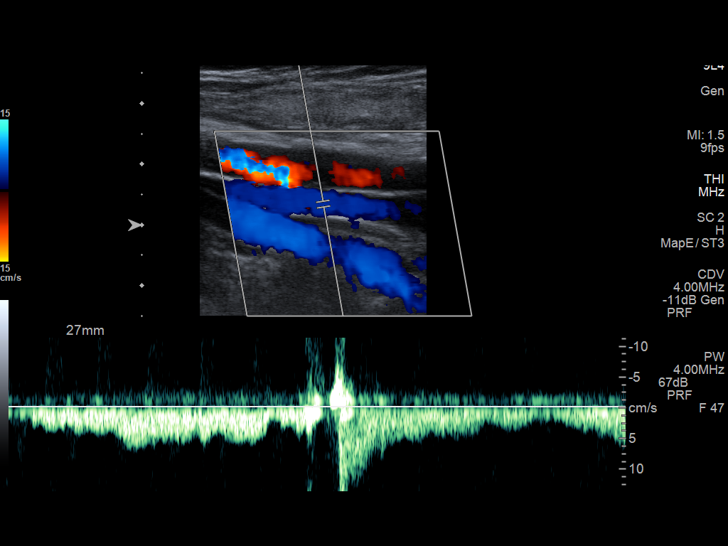
[im 20/46]
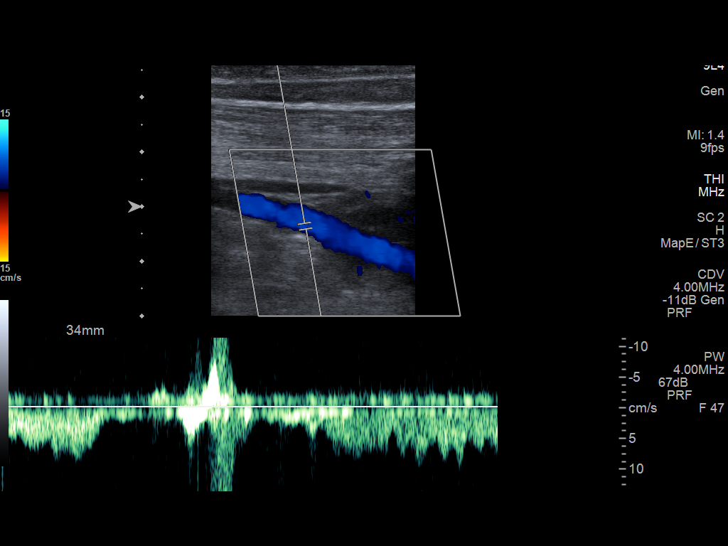
[im 24/46]
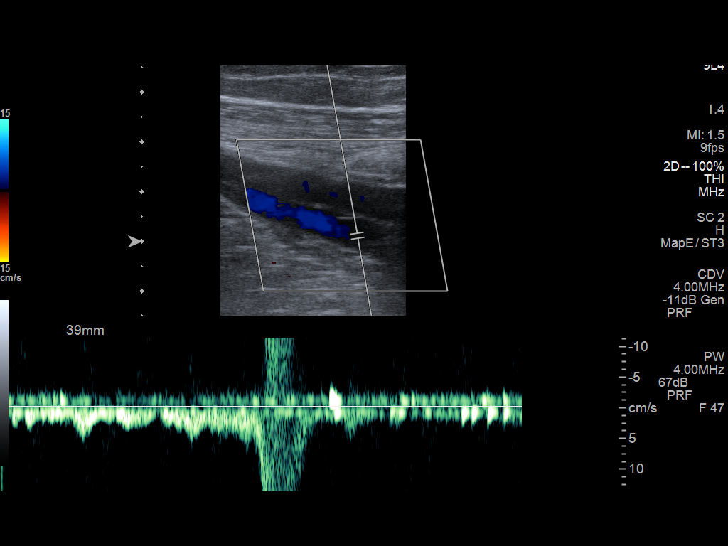
[im 26/46]
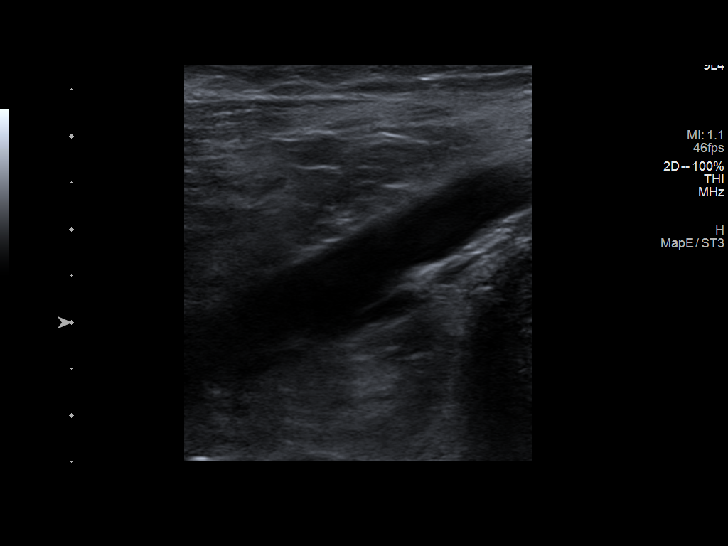
[im 30/46]
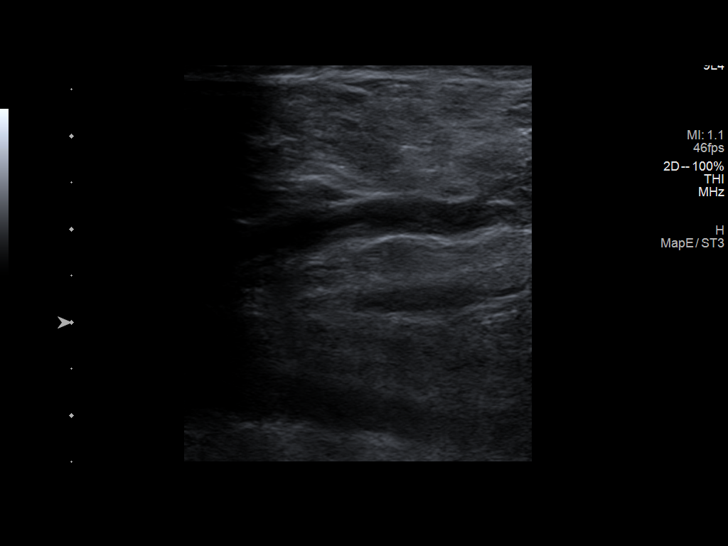
[im 34/46]
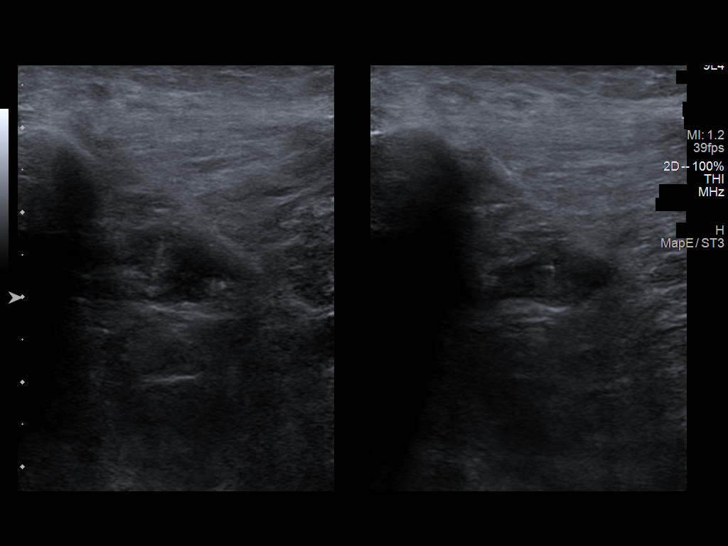
[im 38/46]
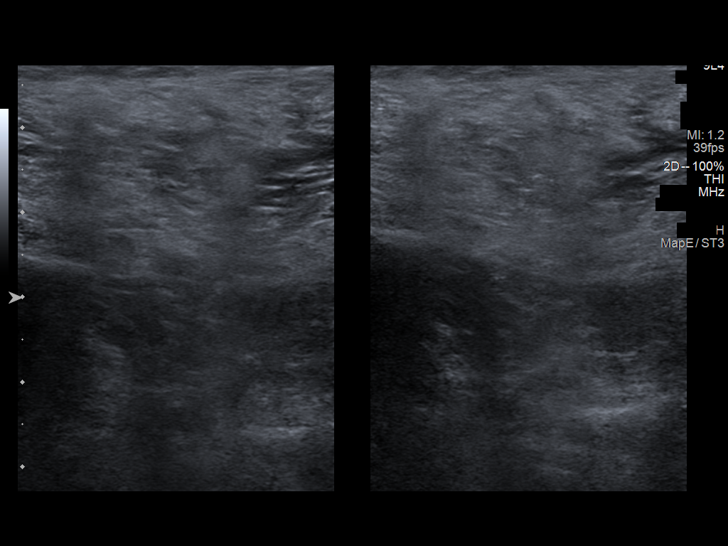
[im 42/46]
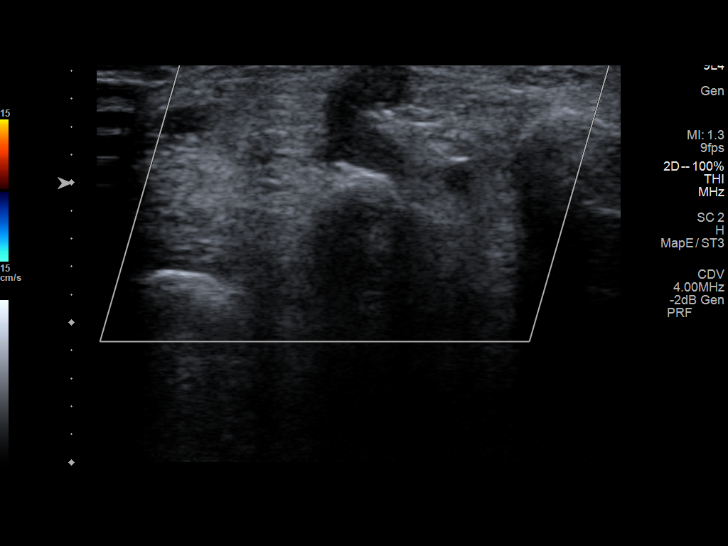
[im 46/46]
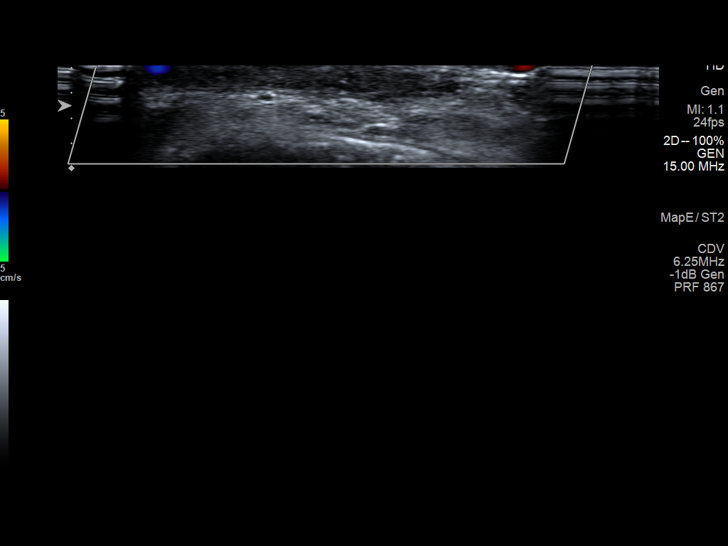

[13 of 24 positions shown; findings below may reference images not displayed]

FINDINGS: VENOUS

Normal compressibility of the common femoral, superficial femoral,
and popliteal veins, as well as the visualized calf veins.
Visualized portions of profunda femoral vein and great saphenous
vein unremarkable. No filling defects to suggest DVT on grayscale or
color Doppler imaging. Doppler waveforms show normal direction of
venous flow, normal respiratory plasticity and response to
augmentation.

Limited views of the contralateral common femoral vein are
unremarkable.

OTHER

Additional ultrasound interrogation of the dorsal aspect of the
right foot demonstrates a solitary dilated venous varix which is
noncompressible and the lumen filled and expanded with low-level
internal echoes.

Limitations: none
IMPRESSION: 1. Positive for superficial thrombophlebitis involving a dilated
vein along the dorsum of the foot.
2. No evidence of deep venous thrombosis throughout the right lower
extremity.

## 2020-07-10 NOTE — Progress Notes (Signed)
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  He doesn't exhibit any UMN signs to suggest cervical myelopathy, but sometimes such findings are not seen on exam.  He has diabetes but he doesn't exhibit any significant sensory loss in the feet that I believe would be causing such balance issues.  He has chronic dysconjugate gaze.  If it has gotten any worse, this may be a cause of his balance problem.  He hasn't seen an ophthalmologist in awhile.  He will first see ophthalmology for evaluation.  He may benefit from prisms.  If not effective, then we can pursue other testing such as MRI of cervical spine and/or NCV-EMG of lower extremities.  Subjective:  David Gill is a 60 year old left-handed male with HTN who follows up for balance disorder.  He is accompanied by his wife who supplements history.  UPDATE: MRI of brain without contrast on 03/30/2020 was personally reviewed and showed a few punctate T2/FLAIR hyperintense foci within the cerebral white matter of no clinical significance, otherwise unremarkable.  Overall, no change.  Still hasn't had an eye exam. Still has some neck pain but no numbness.     HISTORY: In 2021, he had multiple issues.  He had pneumonia in June 2021.  He then developed dizziness with palpitations and fluctuating blood pressure.  He was started on metoprolol and miratazapine.  He stopped the mirtazapine after a month due to side effects such as nightmares.  He was treated with Augmentin for possible tooth infection as he endorsed jaw pain.  CT sinuses showed carious posterior dentition including posterior right mandible molar with abnormal periapical lucency but no associated inflammation.  He was admitted to the hospital in September 2021 for his ongoing symptoms such as tachycardia, dizziness and anxiety.  TSH  was 1.305.  He also had been experiencing abdominal pain.  He endorsed low back pain radiating into the left hip pain.  MRI of lumbar spine from October 2021 showed spondylosis most significant at L3-4 causing moderate bilateral neural foraminal narrowing as well as L4-5 and L5-S1 mild neural foraminal narrowing.  He says the dizziness isn't a spinning, lightheadedness or wooziness.  It occurs when standing and states he feels like he is standing on a boat, an undulating sensation.  No headache, nausea, new visual deficits.  He has had some intermittent neck pain and numbness in his arms.  No weakness.  It is persistent when standing.  No falls.  He had blood work performed last week which reportedly showed a Hgb A1c of 11 with serum glucose 500.  He also had elevated cholesterol and his triglycerides were "off the charts".  He was born with retinal abnormalities with     PAST MEDICAL HISTORY: Past Medical History:  Diagnosis Date  . Abnormal gait 03/21/2020  . Adjustment disorder with emotional disturbance 08/13/2019   Formatting of this note might be different from the original. 2021  . Allergic rhinitis 10/14/2019  . Anxiety 10/23/2019  . Atrophic kidney 10/24/2019   Formatting of this note might be different from the original. 2021:  CT right hydronephrosis  . Back pain   . Body mass index (BMI) 32.0-32.9, adult 02/10/2020  . Diabetes mellitus without complication (HCC)   . Dizziness 10/23/2019  . Essential hypertension 10/23/2019  . Exotropia, left eye 09/13/2015  . Generalized abdominal pain 11/23/2019   Formatting  of this note might be different from the original. 11/23/2019:  . History of tobacco abuse   . Hyperglycemia   . Hypertension   . Hypertriglyceridemia 04/07/2020  . Hypokalemia 10/23/2019  . Insomnia   . Left lumbar radiculitis 02/23/2019   Formatting of this note might be different from the original. 2021: MRI DDD with impingement  . Low testosterone level in male   . Macular scar of  both eyes 09/13/2015  . Metabolic acidosis with increased anion gap and accumulation of organic acids 10/23/2019  . Osteoarthritis 10/23/2019  . SIRS (systemic inflammatory response syndrome) (HCC) 10/23/2019  . Tachycardia 10/23/2019    MEDICATIONS: Current Outpatient Medications on File Prior to Visit  Medication Sig Dispense Refill  . acetaminophen (TYLENOL) 500 MG tablet Take 500 mg by mouth every 6 (six) hours as needed for moderate pain.    Marland Kitchen amLODipine (NORVASC) 10 MG tablet Take 1 tablet (10 mg total) by mouth daily. 180 tablet 3  . benzonatate (TESSALON) 100 MG capsule Take 1 capsule (100 mg total) by mouth 3 (three) times daily as needed. 30 capsule 0  . cetirizine (ZYRTEC) 10 MG tablet Take 10 mg by mouth daily.    . fenofibrate 160 MG tablet Take 160 mg by mouth daily.    . fluticasone (FLONASE) 50 MCG/ACT nasal spray Place 2 sprays into both nostrils daily. 16 g 0  . hydrochlorothiazide (HYDRODIURIL) 12.5 MG tablet Take 12.5 mg by mouth daily.    . hydrOXYzine (ATARAX/VISTARIL) 25 MG tablet Take 1 tablet (25 mg total) by mouth 3 (three) times daily as needed for up to 30 doses for anxiety. 30 tablet 0  . metoprolol succinate (TOPROL-XL) 100 MG 24 hr tablet Take 50 mg by mouth daily. Take with or immediately following a meal.    . omega-3 acid ethyl esters (LOVAZA) 1 g capsule Take 2 capsules by mouth 2 (two) times daily.    . rosuvastatin (CRESTOR) 10 MG tablet Take 1 tablet (10 mg total) by mouth daily. 30 tablet 6  . Semaglutide (RYBELSUS) 3 MG TABS Take 3 mg by mouth daily. 30 tablet 11  . sitaGLIPtin (JANUVIA) 50 MG tablet Take 1 tablet (50 mg total) by mouth daily. 90 tablet 3  . triamcinolone cream (KENALOG) 0.1 % Apply 1 application topically as needed (rash).    . zolpidem (AMBIEN CR) 12.5 MG CR tablet Take 12.5 mg by mouth at bedtime as needed for sleep.     Current Facility-Administered Medications on File Prior to Visit  Medication Dose Route Frequency Provider Last Rate  Last Admin  . 0.9 %  sodium chloride infusion   Intravenous PRN Pickenpack-Cousar, Arty Baumgartner, NP        ALLERGIES: Allergies  Allergen Reactions  . Sulfamethoxazole Hives  . Triamcinolone Acetonide Hives    FAMILY HISTORY: Family History  Problem Relation Age of Onset  . Colon cancer Mother   . Heart attack Mother   . CAD Mother   . Hypertension Father   . Diabetes Sister   . Diabetes Brother   . Breast cancer Maternal Grandmother   . Stroke Maternal Grandfather   . Diabetes Paternal Grandmother   . CAD Paternal Grandfather       Objective:  Blood pressure 132/81, pulse 82, height 5\' 5"  (1.651 m), weight 182 lb 9.6 oz (82.8 kg), SpO2 98 %. General: No acute distress.  Patient appears well-groomed.   Head:  Normocephalic/atraumatic Eyes:  Fundi examined but not visualized Neck: supple, no  paraspinal tenderness, full range of motion Heart:  Regular rate and rhythm Lungs:  Clear to auscultation bilaterally Back: No paraspinal tenderness Neurological Exam: alert and oriented to person, place, and time. Speech fluent and not dysarthric, language intact.  Left eye abducted on primary gaze with reduced movement when looking to right.  No nystagmus, no ptosis.  Questionable slight right lower facial droop.  Otherwise, CN II-XII intact. Bulk and tone normal, muscle strength 5/5 throughout.  Sensation to pinprick intact. Vibratory sensation only slightly reduced in toes.  Deep tendon reflexes 2+ throughout, toes downgoing.  Finger to nose and heel to shin testing intact.  Cautious but steady gait.  Romberg negative.     Shon Millet, DO  CC: Assunta Found, MD

## 2020-07-12 ENCOUNTER — Encounter: Payer: Self-pay | Admitting: Neurology

## 2020-07-12 ENCOUNTER — Telehealth: Payer: Self-pay | Admitting: Neurology

## 2020-07-12 ENCOUNTER — Other Ambulatory Visit: Payer: Self-pay

## 2020-07-12 ENCOUNTER — Ambulatory Visit (INDEPENDENT_AMBULATORY_CARE_PROVIDER_SITE_OTHER): Payer: 59 | Admitting: Neurology

## 2020-07-12 VITALS — BP 132/81 | HR 82 | Ht 65.0 in | Wt 182.6 lb

## 2020-07-12 DIAGNOSIS — R2689 Other abnormalities of gait and mobility: Secondary | ICD-10-CM

## 2020-07-12 DIAGNOSIS — H31013 Macula scars of posterior pole (postinflammatory) (post-traumatic), bilateral: Secondary | ICD-10-CM

## 2020-07-12 NOTE — Telephone Encounter (Signed)
Referral for [facility] added

## 2020-07-12 NOTE — Telephone Encounter (Signed)
Patient called back in after he left and does want Dr. Everlena Cooper to recommend an Ophthalmologist for him to go to.

## 2020-07-12 NOTE — Patient Instructions (Signed)
1.  Follow up next available 2.  In meantime, follow up with ophthalmologist.  Have the doctor fax his not to me.  If unremarkable, then contact me and we can pursue other testing that was discussed.

## 2020-07-14 ENCOUNTER — Other Ambulatory Visit: Payer: Self-pay

## 2020-07-17 ENCOUNTER — Telehealth: Payer: Self-pay | Admitting: Neurology

## 2020-07-17 NOTE — Telephone Encounter (Signed)
Advised, To call insurance to see who would be inn network.

## 2020-07-17 NOTE — Telephone Encounter (Signed)
Pt called [facility] to see if they take his insurance and he was told they were not taking any new patients. He will need a referral to a different eye doctor that takes [facility].

## 2020-07-18 ENCOUNTER — Ambulatory Visit (INDEPENDENT_AMBULATORY_CARE_PROVIDER_SITE_OTHER): Payer: 59 | Admitting: Cardiology

## 2020-07-18 ENCOUNTER — Encounter: Payer: Self-pay | Admitting: Cardiology

## 2020-07-18 ENCOUNTER — Other Ambulatory Visit: Payer: Self-pay

## 2020-07-18 VITALS — BP 156/84 | HR 92 | Ht 65.6 in | Wt 182.4 lb

## 2020-07-18 DIAGNOSIS — E781 Pure hyperglyceridemia: Secondary | ICD-10-CM | POA: Diagnosis not present

## 2020-07-18 DIAGNOSIS — I10 Essential (primary) hypertension: Secondary | ICD-10-CM | POA: Diagnosis not present

## 2020-07-18 DIAGNOSIS — E088 Diabetes mellitus due to underlying condition with unspecified complications: Secondary | ICD-10-CM | POA: Insufficient documentation

## 2020-07-18 DIAGNOSIS — Z87891 Personal history of nicotine dependence: Secondary | ICD-10-CM | POA: Diagnosis not present

## 2020-07-18 DIAGNOSIS — N289 Disorder of kidney and ureter, unspecified: Secondary | ICD-10-CM | POA: Diagnosis not present

## 2020-07-18 HISTORY — DX: Diabetes mellitus due to underlying condition with unspecified complications: E08.8

## 2020-07-18 LAB — BASIC METABOLIC PANEL
BUN/Creatinine Ratio: 13 (ref 9–20)
BUN: 20 mg/dL (ref 6–24)
CO2: 24 mmol/L (ref 20–29)
Calcium: 10.4 mg/dL — ABNORMAL HIGH (ref 8.7–10.2)
Chloride: 98 mmol/L (ref 96–106)
Creatinine, Ser: 1.5 mg/dL — ABNORMAL HIGH (ref 0.76–1.27)
Glucose: 124 mg/dL — ABNORMAL HIGH (ref 65–99)
Potassium: 4.5 mmol/L (ref 3.5–5.2)
Sodium: 137 mmol/L (ref 134–144)
eGFR: 54 mL/min/{1.73_m2} — ABNORMAL LOW (ref >59)

## 2020-07-18 LAB — TSH: TSH: 2.14 u[IU]/mL (ref 0.450–4.500)

## 2020-07-18 MED ORDER — DILTIAZEM HCL ER COATED BEADS 240 MG PO CP24: 240.0000 mg | ORAL_CAPSULE | Freq: Every day | ORAL | 3 refills | Status: DC

## 2020-07-18 NOTE — Patient Instructions (Signed)
Medication Instructions:  Your physician has recommended you make the following change in your medication:   Start Cardizem CD 240 mg daily. Stop Amlodipine Stop Metoprolol    *If you need a refill on your cardiac medications before your next appointment, please call your pharmacy*   Lab Work: Your physician recommends that you have a BMET and tsh today.  If you have labs (blood work) drawn today and your tests are completely normal, you will receive your results only by: MyChart Message (if you have MyChart) OR A paper copy in the mail If you have any lab test that is abnormal or we need to change your treatment, we will call you to review the results.   Testing/Procedures: None ordered   Follow-Up: At [facility], you and your health needs are our priority.  As part of our continuing mission to provide you with exceptional heart care, we have created designated Provider Care Teams.  These Care Teams include your primary Cardiologist (physician) and Advanced Practice Providers (APPs -  Physician Assistants and Nurse Practitioners) who all work together to provide you with the care you need, when you need it.  We recommend signing up for the patient portal called "MyChart".  Sign up information is provided on this After Visit Summary.  MyChart is used to connect with patients for Virtual Visits (Telemedicine).  Patients are able to view lab/test results, encounter notes, upcoming appointments, etc.  Non-urgent messages can be sent to your provider as well.   To learn more about what you can do with MyChart, go to [email or web address].    Your next appointment:   2 weeks  The format for your next appointment:   In Person  Provider:   Belva Crome, MD   Other Instructions Diltiazem Extended-Release Capsules or Tablets What is this medication? DILTIAZEM (dil TYE a zem) treats high blood pressure and prevents chest pain (angina). It works by relaxing the blood vessels,  which helps decrease the amount of work your heart has to do. It belongs to a group of medicationscalled calcium channel blockers. This medicine may be used for other purposes; ask your health care provider orpharmacist if you have questions. COMMON BRAND NAME(S): Cardizem CD, Cardizem LA, Cardizem SR, Cartia XT, Dilacor XR, Dilt-CD, Diltia XT, Diltzac, Matzim LA, Verna Czech, TIADYLT ER, Tiamate,Tiazac What should I tell my care team before I take this medication? They need to know if you have any of these conditions: Heart attack Heart disease Irregular heartbeat or rhythm Low blood pressure An unusual or allergic reaction to diltiazem, other medications, foods, dyes, or preservatives Pregnant or trying to get pregnant Breast-feeding How should I use this medication? Take this medication by mouth. Take it as directed on the prescription label at the same time every day. Do not cut, crush or chew this medication. Swallow the capsules whole. You can take it with or without food. If it upsets your stomach, take it with food. Keep taking it unless your care team tells you tostop. Talk to your care team about the use of this medication in children. Specialcare may be needed. Overdosage: If you think you have taken too much of this medicine contact apoison control center or emergency room at once. NOTE: This medicine is only for you. Do not share this medicine with others. What if I miss a dose? If you miss a dose, take it as soon as you can. If it is almost time for yournext dose, take only that dose. Do not  take double or extra doses. What may interact with this medication? Do not take this medication with any of the following: Cisapride Hawthorn Pimozide Ranolazine Red yeast rice This medication may also interact with the following: Buspirone Carbamazepine Cimetidine Cyclosporine Digoxin Local anesthetics or general anesthetics Lovastatin Medications for anxiety or difficulty sleeping  like midazolam and triazolam Medications for high blood pressure or heart problems Quinidine Rifampin, rifabutin, or rifapentine This list may not describe all possible interactions. Give your health care provider a list of all the medicines, herbs, non-prescription drugs, or dietary supplements you use. Also tell them if you smoke, drink alcohol, or use illegaldrugs. Some items may interact with your medicine. What should I watch for while using this medication? Visit your care team for regular checks on your progress. Check your bloodpressure as directed. Ask your care team what your blood pressure should be. Do not treat yourself for coughs, colds, or pain while you are using this medication without asking your care team for advice. Some medications mayincrease your blood pressure. This medication may cause serious skin reactions. They can happen weeks to months after starting the medication. Contact your care team right away if you notice fevers or flu-like symptoms with a rash. The rash may be red or purple and then turn into blisters or peeling of the skin. Or, you might notice a red rash with swelling of the face, lips or lymph nodes in your neck or under yourarms. You may get drowsy or dizzy. Do not drive, use machinery, or do anything that needs mental alertness until you know how this medication affects you. Do not stand up or sit up quickly, especially if you are an older patient. Thisreduces the risk of dizzy or fainting spells. What side effects may I notice from receiving this medication? Side effects that you should report to your care team as soon as possible: Allergic reactions-skin rash, itching, hives, swelling of the face, lips, tongue, or throat Heart failure-shortness of breath, swelling of the ankles, feet, or hands, sudden weight gain, unusual weakness or fatigue Slow heartbeat-dizziness, feeling faint or lightheaded, trouble breathing, unusual weakness or fatigue Liver  injury-right upper belly pain, loss of appetite, nausea, light-colored stool, dark yellow or brown urine, yellowing skin or eyes, unusual weakness or fatigue Low blood pressure-dizziness, feeling faint or lightheaded, blurry vision Redness, blistering, peeling, or loosening of the skin, including inside the mouth Side effects that usually do not require medical attention (report to your careteam if they continue or are bothersome): Constipation Facial flushing, redness Headache This list may not describe all possible side effects. Call your doctor for medical advice about side effects. You may report side effects to FDA at1-800-FDA-1088. Where should I keep my medication? Keep out of the reach of children and pets. Store at room temperature between 20 and 25 degrees C (68 and 77 degrees F). Protect from moisture. Keep the container tightly closed. Throw away any unusedmedication after the expiration date. NOTE: This sheet is a summary. It may not cover all possible information. If you have questions about this medicine, talk to your doctor, pharmacist, orhealth care provider.  2022 Elsevier/Gold Standard (2020-04-04 14:10:39)

## 2020-07-18 NOTE — Progress Notes (Signed)
Diagnosis: COVID  Provider:  Chilton Greathouse, MD  Procedure: Infusion  IV Type: Peripheral, IV Location: R Hand  Bebtelovimab, Dose: 175 mg  Infusion Start Time: 1349  Infusion Stop Time: 1420  Post Infusion IV Care: Observation period completed and Peripheral IV Discontinued  Discharge: Condition: Good, Destination: Home . AVS provided to patient.   Performed by:  Governor Specking, RN

## 2020-07-18 NOTE — Progress Notes (Signed)
Cardiology Office Note:    Date:  07/18/2020   ID:  David Gill, DOB May 05, 1960, MRN [id number]  PCP:  Assunta Found, MD  Cardiologist:  Garwin Brothers, MD   Referring MD: Assunta Found, MD    ASSESSMENT:    No diagnosis found. PLAN:    In order of problems listed above:  Primary prevention stressed with the patient.  Importance of compliance with diet medication stressed any vocalized understanding. Essential hypertension: He mentions to me that his heart rate is elevated so he went back on the beta-blocker.  He thinks that his beta-blocker might be affecting his gait.  For this reason I will stop the beta-blocker and amlodipine and initiate the patient on Cardizem CD to 40 mg daily.  He will be back in 2 weeks.  He will keep a track of his blood pressure log.  Also because of elevated heart rate I will do a TSH and Chem-7 today. Mixed dyslipidemia, diabetes mellitus and urinary insufficiency: I discussed my findings with the patient in extensive length.  Lifestyle modification diet and exercise stressed and he understands.  He promises to do better.  Recent lab work including lipids were reviewed. Will be seen follow-up appointment in 2 weeks or earlier if he has any concerns.   Medication Adjustments/Labs and Tests Ordered: Current medicines are reviewed at length with the patient today.  Concerns regarding medicines are outlined above.  No orders of the defined types were placed in this encounter.  No orders of the defined types were placed in this encounter.    No chief complaint on file.    History of Present Illness:    David Gill is a 60 y.o. male.  Patient has past medical history of essential hypertension, dyslipidemia and diabetes mellitus.  He has solitary kidney kidney and renal insufficiency.  He had issues with beta-blocker and therefore we had changed.  And cut down the dose and added amlodipine.  He tells me that his heart rate started  going fast therefore he switched back to 50 of metoprolol and amlodipine 5 mg.  He has brought a blood pressure log and it is fine.  His blood pressure is elevated today patient because he has a significant tooth ache.  At the time of my evaluation, the patient is alert awake oriented and in no distress.  Past Medical History:  Diagnosis Date   Abnormal gait 03/21/2020   Adjustment disorder with emotional disturbance 08/13/2019   Formatting of this note might be different from the original. 2021   Allergic rhinitis 10/14/2019   Anxiety 10/23/2019   Atrophic kidney 10/24/2019   Formatting of this note might be different from the original. 2021:  CT right hydronephrosis   Back pain    Body mass index (BMI) 32.0-32.9, adult 02/10/2020   Diabetes mellitus without complication (HCC)    Dizziness 10/23/2019   Essential hypertension 10/23/2019   Exotropia, left eye 09/13/2015   Generalized abdominal pain 11/23/2019   Formatting of this note might be different from the original. 11/23/2019:   History of tobacco abuse    Hyperglycemia    Hypertension    Hypertriglyceridemia 04/07/2020   Hypokalemia 10/23/2019   Insomnia    Left lumbar radiculitis 02/23/2019   Formatting of this note might be different from the original. 2021: MRI DDD with impingement   Low testosterone level in male    Macular scar of both eyes 09/13/2015   Metabolic acidosis with increased anion gap and accumulation  of organic acids 10/23/2019   Osteoarthritis 10/23/2019   Renal insufficiency 06/14/2020   SIRS (systemic inflammatory response syndrome) (HCC) 10/23/2019   Tachycardia 10/23/2019    Past Surgical History:  Procedure Laterality Date   TONSILLECTOMY      Current Medications: Current Meds  Medication Sig   acetaminophen (TYLENOL) 500 MG tablet Take 500 mg by mouth every 6 (six) hours as needed for moderate pain.   amLODipine (NORVASC) 5 MG tablet Take 5 mg by mouth daily.   cetirizine (ZYRTEC) 10 MG tablet Take 10 mg by mouth  daily.   fenofibrate 160 MG tablet Take 160 mg by mouth daily.   fluticasone (FLONASE) 50 MCG/ACT nasal spray Place 2 sprays into both nostrils daily.   hydrochlorothiazide (HYDRODIURIL) 12.5 MG tablet Take 12.5 mg by mouth daily.   metoprolol succinate (TOPROL-XL) 100 MG 24 hr tablet Take 50 mg by mouth daily. Take with or immediately following a meal.   omega-3 acid ethyl esters (LOVAZA) 1 g capsule Take 2 capsules by mouth 2 (two) times daily.   rosuvastatin (CRESTOR) 10 MG tablet Take 10 mg by mouth daily.   sitaGLIPtin (JANUVIA) 50 MG tablet Take 1 tablet (50 mg total) by mouth daily.   triamcinolone cream (KENALOG) 0.1 % Apply 1 application topically as needed for rash (rash).   zolpidem (AMBIEN CR) 12.5 MG CR tablet Take 12.5 mg by mouth at bedtime as needed for sleep.     Allergies:   Sulfamethoxazole and Triamcinolone acetonide   Social History   Socioeconomic History   Marital status: Married    Spouse name: Not on file   Number of children: Not on file   Years of education: Not on file   Highest education level: Not on file  Occupational History   Not on file  Tobacco Use   Smoking status: Former    Pack years: 0.00    Types: Cigarettes   Smokeless tobacco: Never  Substance and Sexual Activity   Alcohol use: Yes    Comment: seldom   Drug use: Never   Sexual activity: Yes  Other Topics Concern   Not on file  Social History Narrative   Left handed   Social Determinants of Health   Financial Resource Strain: Not on file  Food Insecurity: Not on file  Transportation Needs: Not on file  Physical Activity: Not on file  Stress: Not on file  Social Connections: Not on file     Family History: The patient's family history includes Breast cancer in his maternal grandmother; CAD in his mother and paternal grandfather; Colon cancer in his mother; Diabetes in his brother, paternal grandmother, and sister; Heart attack in his mother; Hypertension in his father; Stroke  in his maternal grandfather.  ROS:   Please see the history of present illness.    All other systems reviewed and are negative.  EKGs/Labs/Other Studies Reviewed:    The following studies were reviewed today: I discussed my findings with the patient at length   Recent Labs: 10/23/2019: TSH 1.305 10/25/2019: Magnesium 2.5 03/23/2020: Hemoglobin 17.1; Platelets 423 04/07/2020: BUN 16; Creatinine, Ser 1.45; Potassium 4.8; Sodium 140 05/12/2020: ALT 43  Recent Lipid Panel    Component Value Date/Time   CHOL 131 05/12/2020 0806   TRIG 179 (H) 05/12/2020 0806   HDL 32 (L) 05/12/2020 0806   CHOLHDL 4.1 05/12/2020 0806   LDLCALC 69 05/12/2020 0806    Physical Exam:    VS:  BP (!) 156/84   Pulse  92   Ht 5' 5.6" (1.666 m)   Wt 182 lb 6.4 oz (82.7 kg)   SpO2 96%   BMI 29.80 kg/m     Wt Readings from Last 3 Encounters:  07/18/20 182 lb 6.4 oz (82.7 kg)  07/12/20 182 lb 9.6 oz (82.8 kg)  06/14/20 185 lb 6.4 oz (84.1 kg)     GEN: Patient is in no acute distress HEENT: Normal NECK: No JVD; No carotid bruits LYMPHATICS: No lymphadenopathy CARDIAC: Hear sounds regular, 2/6 systolic murmur at the apex. RESPIRATORY:  Clear to auscultation without rales, wheezing or rhonchi  ABDOMEN: Soft, non-tender, non-distended MUSCULOSKELETAL:  No edema; No deformity  SKIN: Warm and dry NEUROLOGIC:  Alert and oriented x 3 PSYCHIATRIC:  Normal affect   Signed, Garwin Brothers, MD  07/18/2020 9:25 AM    [facility]

## 2020-08-01 ENCOUNTER — Other Ambulatory Visit: Payer: Self-pay

## 2020-08-02 ENCOUNTER — Ambulatory Visit (INDEPENDENT_AMBULATORY_CARE_PROVIDER_SITE_OTHER): Payer: 59 | Admitting: Cardiology

## 2020-08-02 ENCOUNTER — Other Ambulatory Visit: Payer: Self-pay

## 2020-08-02 ENCOUNTER — Encounter: Payer: Self-pay | Admitting: Cardiology

## 2020-08-02 VITALS — BP 130/72 | HR 66 | Ht 65.6 in | Wt 181.4 lb

## 2020-08-02 DIAGNOSIS — I10 Essential (primary) hypertension: Secondary | ICD-10-CM | POA: Diagnosis not present

## 2020-08-02 DIAGNOSIS — E781 Pure hyperglyceridemia: Secondary | ICD-10-CM | POA: Diagnosis not present

## 2020-08-02 DIAGNOSIS — E088 Diabetes mellitus due to underlying condition with unspecified complications: Secondary | ICD-10-CM | POA: Diagnosis not present

## 2020-08-02 NOTE — Progress Notes (Signed)
Cardiology Office Note:    Date:  08/02/2020   ID:  David Gill, DOB [birth year], MRN [id number]  PCP:  Assunta Found, MD  Cardiologist:  Garwin Brothers, MD   Referring MD: Assunta Found, MD    ASSESSMENT:    1. Essential hypertension   2. Hypertriglyceridemia   3. Diabetes mellitus due to underlying condition with unspecified complications (HCC)    PLAN:    In order of problems listed above:  Primary prevention stressed with the patient.  Importance of compliance with diet medication stressed and he vocalized understanding.  He is walking regularly and I congratulated him about it. Essential hypertension: Patient is beta-blockers per his request.  He has tachycardia and therefore calcium channel blockers are working well.  His blood pressure is borderline so do not want to add an ARB or ACE inhibitor at this point.  He understands.  He does not want to change any of his medications at this point.  Therefore he is not on guideline directed medical therapy for diabetes. Diabetes mellitus and renal insufficiency: I discussed this with the patient at length.  Diet and exercise stressed any vocalized understanding.  He follows up with an endocrinologist for this. Mixed dyslipidemia: Diet emphasized.  Lipids reviewed Patient will be seen in follow-up appointment in 6 months or earlier if the patient has any concerns    Medication Adjustments/Labs and Tests Ordered: Current medicines are reviewed at length with the patient today.  Concerns regarding medicines are outlined above.  No orders of the defined types were placed in this encounter.  No orders of the defined types were placed in this encounter.    No chief complaint on file.    History of Present Illness:    David Gill is a 60 y.o. male.  Patient has past medical history of essential hypertension, dyslipidemia and diabetes mellitus.  He denies any problems at this time and takes care of activities of  daily living.  No chest pain orthopnea or PND.  At the time of my evaluation, the patient is alert awake oriented and in no distress.  He is now walking on a regular basis.  He is off beta-blocker because he thought that that is causing problems with his gait.  His gait has improved some but not completely.  He sees a neurologist also for this.  At the time of my evaluation, the patient is alert awake oriented and in no distress.  Past Medical History:  Diagnosis Date   Abnormal gait 03/21/2020   Adjustment disorder with emotional disturbance 08/13/2019   Formatting of this note might be different from the original. 2021   Allergic rhinitis 10/14/2019   Anxiety 10/23/2019   Atrophic kidney 10/24/2019   Formatting of this note might be different from the original. 2021:  CT right hydronephrosis   Back pain    Body mass index (BMI) 32.0-32.9, adult 02/10/2020   Diabetes mellitus due to underlying condition with unspecified complications (HCC) 07/18/2020   Diabetes mellitus without complication (HCC)    Dizziness 10/23/2019   Essential hypertension 10/23/2019   Exotropia, left eye 09/13/2015   Generalized abdominal pain 11/23/2019   Formatting of this note might be different from the original. 11/23/2019:   History of tobacco abuse    Hypercalcemia 06/07/2020   Hyperglycemia    Hypertension    Hypertriglyceridemia 04/07/2020   Hypokalemia 10/23/2019   Insomnia    Left lumbar radiculitis 02/23/2019   Formatting of this note might be  different from the original. 2021: MRI DDD with impingement   Low testosterone level in male    Macular scar of both eyes 09/13/2015   Metabolic acidosis with increased anion gap and accumulation of organic acids 10/23/2019   Osteoarthritis 10/23/2019   Renal insufficiency 06/14/2020   SIRS (systemic inflammatory response syndrome) (HCC) 10/23/2019   Tachycardia 10/23/2019    Past Surgical History:  Procedure Laterality Date   TONSILLECTOMY      Current Medications: Current  Meds  Medication Sig   acetaminophen (TYLENOL) 500 MG tablet Take 500 mg by mouth every 6 (six) hours as needed for moderate pain.   cetirizine (ZYRTEC) 10 MG tablet Take 10 mg by mouth daily.   diltiazem (CARDIZEM CD) 240 MG 24 hr capsule Take 1 capsule (240 mg total) by mouth daily.   fenofibrate 160 MG tablet Take 160 mg by mouth daily.   fluticasone (FLONASE) 50 MCG/ACT nasal spray Place 2 sprays into both nostrils daily.   hydrochlorothiazide (HYDRODIURIL) 12.5 MG tablet Take 12.5 mg by mouth daily.   hydrOXYzine (ATARAX/VISTARIL) 25 MG tablet Take 25 mg by mouth as needed for anxiety.   omega-3 acid ethyl esters (LOVAZA) 1 g capsule Take 2 capsules by mouth 2 (two) times daily.   rosuvastatin (CRESTOR) 10 MG tablet Take 10 mg by mouth daily.   sitaGLIPtin (JANUVIA) 50 MG tablet Take 1 tablet (50 mg total) by mouth daily.   zolpidem (AMBIEN CR) 12.5 MG CR tablet Take 12.5 mg by mouth at bedtime as needed for sleep.     Allergies:   Sulfamethoxazole and Triamcinolone acetonide   Social History   Socioeconomic History   Marital status: Married    Spouse name: Not on file   Number of children: Not on file   Years of education: Not on file   Highest education level: Not on file  Occupational History   Not on file  Tobacco Use   Smoking status: Former    Pack years: 0.00    Types: Cigarettes   Smokeless tobacco: Never  Substance and Sexual Activity   Alcohol use: Yes    Comment: seldom   Drug use: Never   Sexual activity: Yes  Other Topics Concern   Not on file  Social History Narrative   Left handed   Social Determinants of Health   Financial Resource Strain: Not on file  Food Insecurity: Not on file  Transportation Needs: Not on file  Physical Activity: Not on file  Stress: Not on file  Social Connections: Not on file     Family History: The patient's family history includes Breast cancer in his maternal grandmother; CAD in his mother and paternal grandfather;  Colon cancer in his mother; Diabetes in his brother, paternal grandmother, and sister; Heart attack in his mother; Hypertension in his father; Stroke in his maternal grandfather.  ROS:   Please see the history of present illness.    All other systems reviewed and are negative.  EKGs/Labs/Other Studies Reviewed:    The following studies were reviewed today: I discussed my findings with the patient at length.   Recent Labs: 10/25/2019: Magnesium 2.5 03/23/2020: Hemoglobin 17.1; Platelets 423 05/12/2020: ALT 43 07/18/2020: BUN 20; Creatinine, Ser 1.50; Potassium 4.5; Sodium 137; TSH 2.140  Recent Lipid Panel    Component Value Date/Time   CHOL 131 05/12/2020 0806   TRIG 179 (H) 05/12/2020 0806   HDL 32 (L) 05/12/2020 0806   CHOLHDL 4.1 05/12/2020 0806   LDLCALC 69 05/12/2020  3149    Physical Exam:    VS:  BP 130/72   Pulse 66   Ht 5' 5.6" (1.666 m)   Wt 181 lb 6.4 oz (82.3 kg)   SpO2 97%   BMI 29.64 kg/m     Wt Readings from Last 3 Encounters:  08/02/20 181 lb 6.4 oz (82.3 kg)  07/18/20 182 lb 6.4 oz (82.7 kg)  07/12/20 182 lb 9.6 oz (82.8 kg)     GEN: Patient is in no acute distress HEENT: Normal NECK: No JVD; No carotid bruits LYMPHATICS: No lymphadenopathy CARDIAC: Hear sounds regular, 2/6 systolic murmur at the apex. RESPIRATORY:  Clear to auscultation without rales, wheezing or rhonchi  ABDOMEN: Soft, non-tender, non-distended MUSCULOSKELETAL:  No edema; No deformity  SKIN: Warm and dry NEUROLOGIC:  Alert and oriented x 3 PSYCHIATRIC:  Normal affect   Signed, Garwin Brothers, MD  08/02/2020 8:21 AM    [facility]

## 2020-08-02 NOTE — Patient Instructions (Signed)

## 2020-08-05 ENCOUNTER — Other Ambulatory Visit: Payer: Self-pay | Admitting: Endocrinology

## 2020-08-08 ENCOUNTER — Other Ambulatory Visit: Payer: Self-pay | Admitting: Cardiology

## 2020-08-08 DIAGNOSIS — E781 Pure hyperglyceridemia: Secondary | ICD-10-CM

## 2020-08-09 ENCOUNTER — Telehealth: Payer: Self-pay | Admitting: Neurology

## 2020-08-09 ENCOUNTER — Ambulatory Visit: Payer: 59 | Admitting: Endocrinology

## 2020-08-09 ENCOUNTER — Other Ambulatory Visit: Payer: Self-pay

## 2020-08-09 VITALS — BP 140/50 | HR 80 | Ht 65.6 in | Wt 180.0 lb

## 2020-08-09 DIAGNOSIS — G959 Disease of spinal cord, unspecified: Secondary | ICD-10-CM

## 2020-08-09 DIAGNOSIS — R9082 White matter disease, unspecified: Secondary | ICD-10-CM

## 2020-08-09 DIAGNOSIS — E119 Type 2 diabetes mellitus without complications: Secondary | ICD-10-CM

## 2020-08-09 LAB — POCT GLYCOSYLATED HEMOGLOBIN (HGB A1C): Hemoglobin A1C: 5.7 % — AB (ref 4.0–5.6)

## 2020-08-09 NOTE — Progress Notes (Signed)
Subjective:    Patient ID: David Gill, male    DOB: May 21, 1960, 60 y.o.   MRN: [id number]  HPI Pt returns for f/u of diabetes mellitus:  DM type: 2 Dx'ed: 2022 Complications: PN and stage 3 CRI Therapy: Januvia DKA: never Severe hypoglycemia: never Pancreatitis: never Pancreatic imaging: normal on 2021 CT SDOH: none Other: he could add metformin if necessary Interval history: He did not change Janumet to Rybelsus.  Instead, he takes just Januvia 50/d.  Meter is downloaded today, and the printout is scanned into the record.  Cbg varies from 93-179, but most are approx 100.  Pt also has hypercalcemia (dx'ed 2022; PTH is low-normal; he does not take Vit-D; he takes HCTZ).   Past Medical History:  Diagnosis Date   Abnormal gait 03/21/2020   Adjustment disorder with emotional disturbance 08/13/2019   Formatting of this note might be different from the original. 2021   Allergic rhinitis 10/14/2019   Anxiety 10/23/2019   Atrophic kidney 10/24/2019   Formatting of this note might be different from the original. 2021:  CT right hydronephrosis   Back pain    Body mass index (BMI) 32.0-32.9, adult 02/10/2020   Diabetes mellitus due to underlying condition with unspecified complications (HCC) 07/18/2020   Diabetes mellitus without complication (HCC)    Dizziness 10/23/2019   Essential hypertension 10/23/2019   Exotropia, left eye 09/13/2015   Generalized abdominal pain 11/23/2019   Formatting of this note might be different from the original. 11/23/2019:   History of tobacco abuse    Hypercalcemia 06/07/2020   Hyperglycemia    Hypertension    Hypertriglyceridemia 04/07/2020   Hypokalemia 10/23/2019   Insomnia    Left lumbar radiculitis 02/23/2019   Formatting of this note might be different from the original. 2021: MRI DDD with impingement   Low testosterone level in male    Macular scar of both eyes 09/13/2015   Metabolic acidosis with increased anion gap and accumulation of organic acids  10/23/2019   Osteoarthritis 10/23/2019   Renal insufficiency 06/14/2020   SIRS (systemic inflammatory response syndrome) (HCC) 10/23/2019   Tachycardia 10/23/2019    Past Surgical History:  Procedure Laterality Date   TONSILLECTOMY      Social History   Socioeconomic History   Marital status: Married    Spouse name: Not on file   Number of children: Not on file   Years of education: Not on file   Highest education level: Not on file  Occupational History   Not on file  Tobacco Use   Smoking status: Former    Pack years: 0.00    Types: Cigarettes   Smokeless tobacco: Never  Substance and Sexual Activity   Alcohol use: Yes    Comment: seldom   Drug use: Never   Sexual activity: Yes  Other Topics Concern   Not on file  Social History Narrative   Left handed   Social Determinants of Health   Financial Resource Strain: Not on file  Food Insecurity: Not on file  Transportation Needs: Not on file  Physical Activity: Not on file  Stress: Not on file  Social Connections: Not on file  Intimate Partner Violence: Not on file    Current Outpatient Medications on File Prior to Visit  Medication Sig Dispense Refill   acetaminophen (TYLENOL) 500 MG tablet Take 500 mg by mouth every 6 (six) hours as needed for moderate pain.     cetirizine (ZYRTEC) 10 MG tablet Take 10 mg by  mouth daily.     diltiazem (CARDIZEM CD) 240 MG 24 hr capsule Take 1 capsule (240 mg total) by mouth daily. 90 capsule 3   fenofibrate 160 MG tablet Take 160 mg by mouth daily.     fluticasone (FLONASE) 50 MCG/ACT nasal spray Place 2 sprays into both nostrils daily. 16 g 0   hydrochlorothiazide (HYDRODIURIL) 12.5 MG tablet Take 12.5 mg by mouth daily.     hydrOXYzine (ATARAX/VISTARIL) 25 MG tablet Take 25 mg by mouth as needed for anxiety.     omega-3 acid ethyl esters (LOVAZA) 1 g capsule Take 2 capsules by mouth 2 (two) times daily.     rosuvastatin (CRESTOR) 10 MG tablet TAKE 1 TABLET BY MOUTH EVERY DAY 90  tablet 2   sitaGLIPtin (JANUVIA) 50 MG tablet Take 1 tablet (50 mg total) by mouth daily. 90 tablet 3   zolpidem (AMBIEN CR) 12.5 MG CR tablet Take 12.5 mg by mouth at bedtime as needed for sleep.     No current facility-administered medications on file prior to visit.    Allergies  Allergen Reactions   Sulfamethoxazole Hives   Triamcinolone Acetonide Hives    Family History  Problem Relation Age of Onset   Colon cancer Mother    Heart attack Mother    CAD Mother    Hypertension Father    Diabetes Sister    Diabetes Brother    Breast cancer Maternal Grandmother    Stroke Maternal Grandfather    Diabetes Paternal Grandmother    CAD Paternal Grandfather     BP (!) 140/50 (BP Location: Right Arm, Patient Position: Sitting, Cuff Size: Normal)   Pulse 80   Ht 5' 5.6" (1.666 m)   Wt 180 lb (81.6 kg)   SpO2 98%   BMI 29.41 kg/m   Review of Systems     Objective:   Physical Exam Pulses: dorsalis pedis intact bilat.   MSK: no deformity of the feet CV: no leg edema Skin:  no ulcer on the feet.  normal color and temp on the feet. Neuro: sensation is intact to touch on the feet   Lab Results  Component Value Date   HGBA1C 5.7 (A) 08/09/2020      Assessment & Plan:  Type 2 DM: well-controlled.  Please continue the same Januvia Hypercalcemia: recheck today  Patient Instructions  check your blood sugar once a day.  vary the time of day when you check, between before the 3 meals, and at bedtime.  also check if you have symptoms of your blood sugar being too high or too low.  please keep a record of the readings and bring it to your next appointment here (or you can bring the meter itself).  You can write it on any piece of paper.  please call us sooner if your blood sugar goes below 70, or if most of your readings are over 200. Please continue the same Januvia. Please see Dr Phillips Odor, to see if the HCTZ can be stopped safely.  This medication may be increasing the calcium  level in your blood).   Please come back for a follow-up appointment in 3-4 months.

## 2020-08-09 NOTE — Patient Instructions (Addendum)
check your blood sugar once a day.  vary the time of day when you check, between before the 3 meals, and at bedtime.  also check if you have symptoms of your blood sugar being too high or too low.  please keep a record of the readings and bring it to your next appointment here (or you can bring the meter itself).  You can write it on any piece of paper.  please call us sooner if your blood sugar goes below 70, or if most of your readings are over 200. Please continue the same Januvia. Please see Dr Phillips Odor, to see if the HCTZ can be stopped safely.  This medication may be increasing the calcium level in your blood).   Please come back for a follow-up appointment in 3-4 months.

## 2020-08-09 NOTE — Telephone Encounter (Signed)
Pt came in stating Dr. Everlena Cooper wanted him to see his eye doctor before having an MRI on his neck. His eye appt is not until 09/28/20, so he is wondering if he should try and get the MRI before his eye appointment since it's so far off?

## 2020-08-10 ENCOUNTER — Telehealth: Payer: Self-pay | Admitting: Endocrinology

## 2020-08-10 NOTE — Telephone Encounter (Signed)
Called patient and left a message for a call back.  

## 2020-08-10 NOTE — Telephone Encounter (Signed)
Patient returned David Gill's call. 

## 2020-08-10 NOTE — Telephone Encounter (Signed)
Called patient and informed him that Dr. Everlena Cooper would like to do the MRI of his cervical spine. Informed patient that I will go ahead and send his MRI to [facility] and once they complete his PA they will call and schedule him. Provide patient with [facility] number to contact them if he doesn't hear back in a week or so. Patient verbalized understanding and had no further questions or concerns.

## 2020-08-11 NOTE — Telephone Encounter (Signed)
Patient called to check on status of Januvia refill.  States had advised office at his last visit that he needed the refill done.  [facility] sent this requests in yesterday. Patient is out of Januvia and [facility] has given him 3 pills to tide him over until his RX is refilled  Please refill RX

## 2020-08-11 NOTE — Telephone Encounter (Signed)
Called and left a detail voicemail for pt to advise pt that he has 3 refill on januvia 50mg  , asked pt to get [facility] a call to request a refill on his medication , if he had any concerns to call back.

## 2020-08-11 NOTE — Telephone Encounter (Signed)
Pt called to let us know he went into the pharmacy and brought his pill bottle with him to ask for a refill and they told him his prescription is invalid at this time. Pt is wondering why and where to go from here?

## 2020-08-14 ENCOUNTER — Other Ambulatory Visit: Payer: Self-pay

## 2020-08-14 ENCOUNTER — Other Ambulatory Visit: Payer: Self-pay | Admitting: Endocrinology

## 2020-08-14 DIAGNOSIS — E119 Type 2 diabetes mellitus without complications: Secondary | ICD-10-CM

## 2020-08-14 MED ORDER — SITAGLIPTIN PHOSPHATE 50 MG PO TABS: 50.0000 mg | ORAL_TABLET | Freq: Every day | ORAL | 3 refills | Status: DC

## 2020-08-15 ENCOUNTER — Ambulatory Visit
Admission: RE | Admit: 2020-08-15 | Discharge: 2020-08-15 | Disposition: A | Discharge status: Home / Self Care | Payer: 59 | Source: Ambulatory Visit | Attending: Neurology | Admitting: Neurology

## 2020-08-15 ENCOUNTER — Other Ambulatory Visit: Payer: Self-pay

## 2020-08-15 DIAGNOSIS — G959 Disease of spinal cord, unspecified: Secondary | ICD-10-CM

## 2020-08-15 DIAGNOSIS — R9082 White matter disease, unspecified: Secondary | ICD-10-CM

## 2020-08-15 IMAGING — MR MR CERVICAL SPINE W/O CM
5 series · 35 of 48 positions shown · non-contrast
Comparison: None.

CLINICAL DATA: Cervical myelopathy

EXAM:
MRI CERVICAL SPINE WITHOUT CONTRAST
TECHNIQUE: Multiplanar, multisequence MR imaging of the cervical spine was
performed. No intravenous contrast was administered.

[Series 2: T2 · sagittal · 3.0mm · 0.41mm/px · 8 of 15 slices shown (1 of 2)]
[im 1/15]
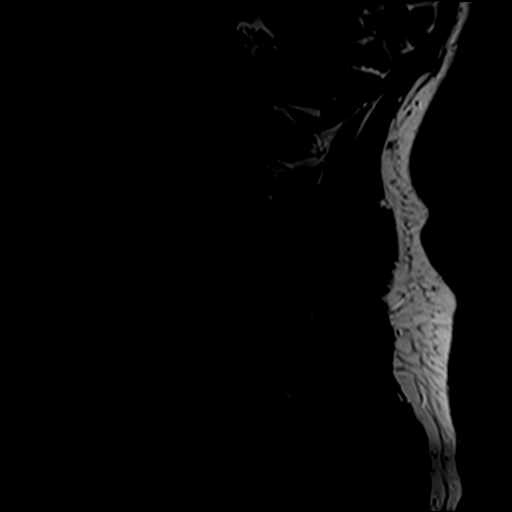
[im 3/15]
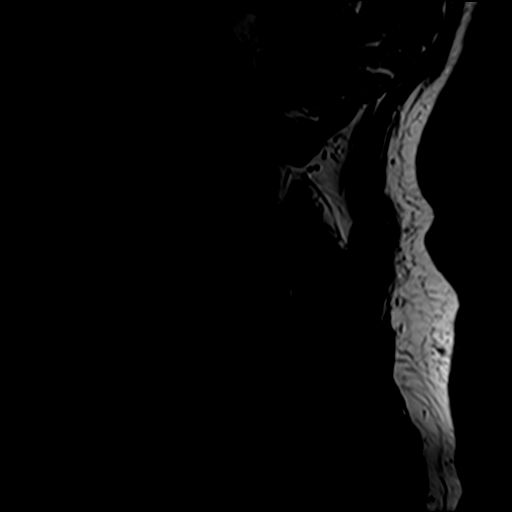
[im 5/15]
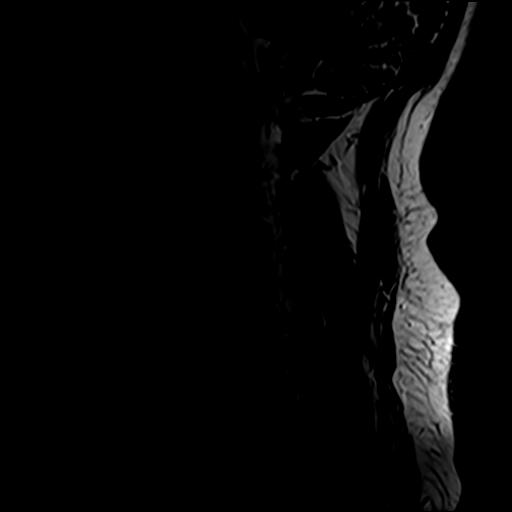
[im 7/15]
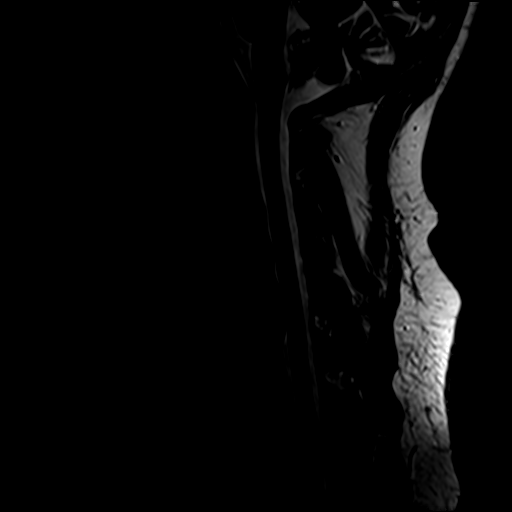
[im 9/15]
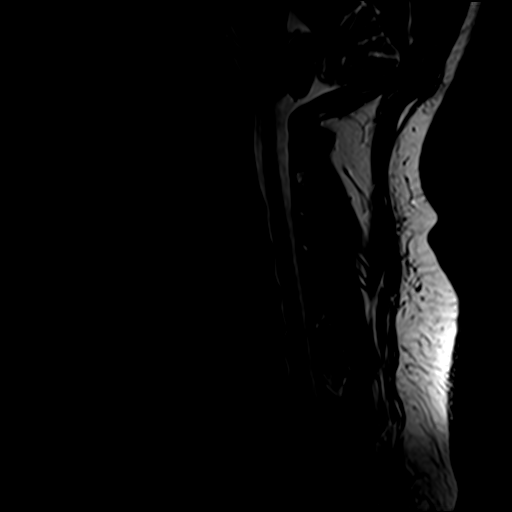
[im 11/15]
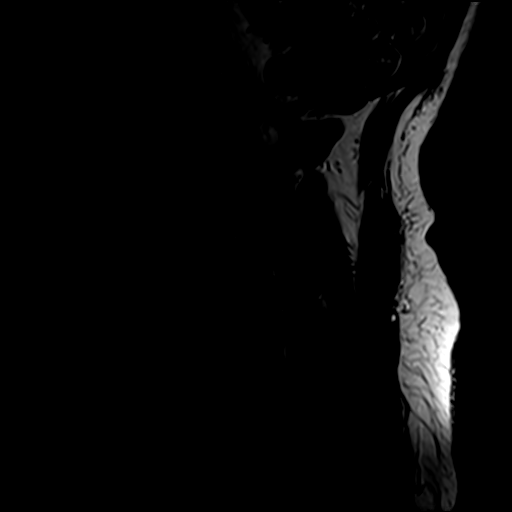
[im 13/15]
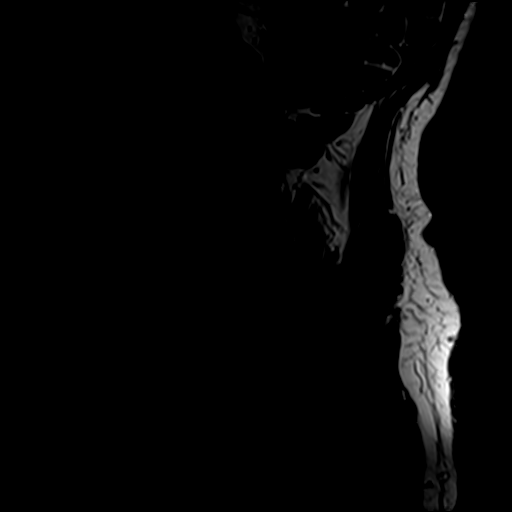
[im 15/15]
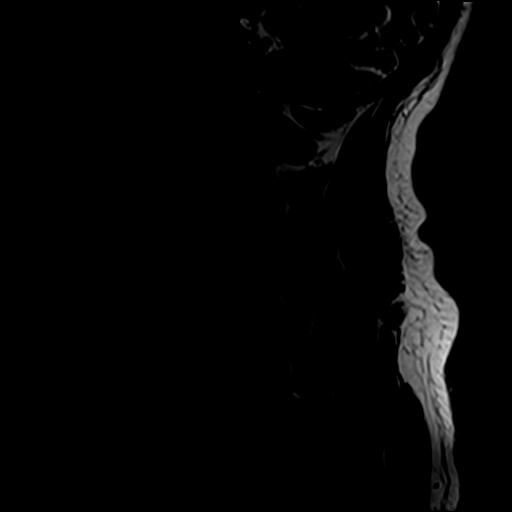

[Series 3: STIR · sagittal · 3.0mm · 0.82mm/px · 7 of 15 slices shown]
[im 1/15]
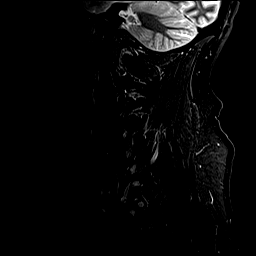
[im 3/15]
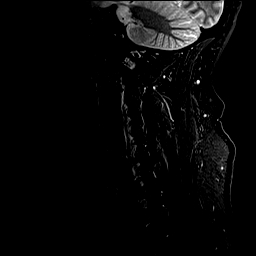
[im 5/15]
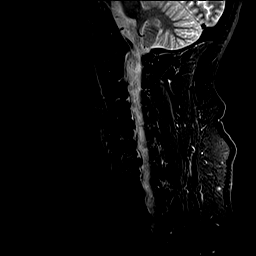
[im 8/15]
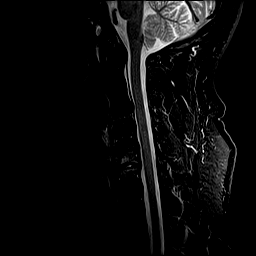
[im 10/15]
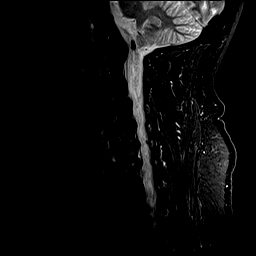
[im 12/15]
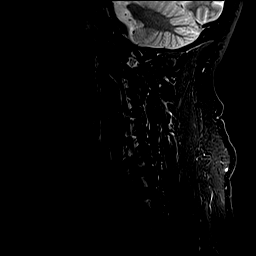
[im 15/15]
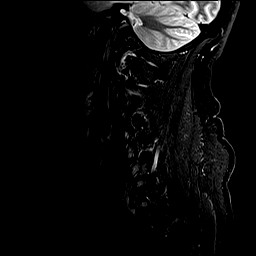

[Series 4: T1 · sagittal · 3.0mm · 0.82mm/px · 7 of 15 slices shown]
[im 1/15]
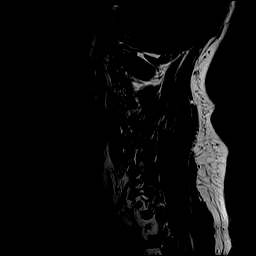
[im 3/15]
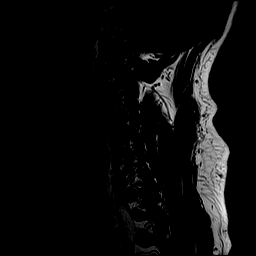
[im 5/15]
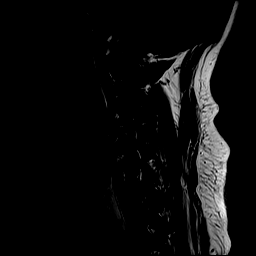
[im 8/15]
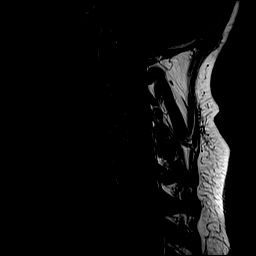
[im 10/15]
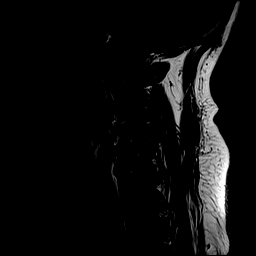
[im 12/15]
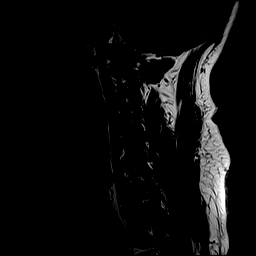
[im 15/15]
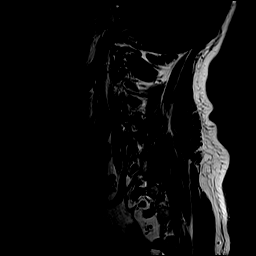

[Series 5: T2 · axial · 3.0mm · 0.70mm/px · z∈[-64,+32]mm · 9 of 27 slices shown (2 of 2)]
[im 1/27]
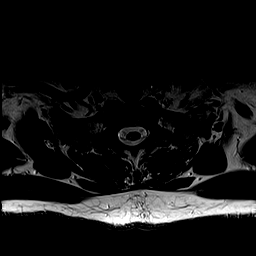
[im 5/27]
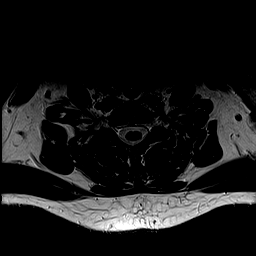
[im 9/27]
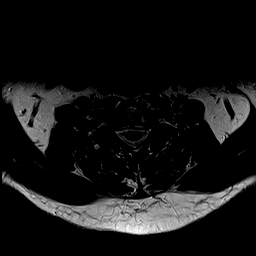
[im 11/27]
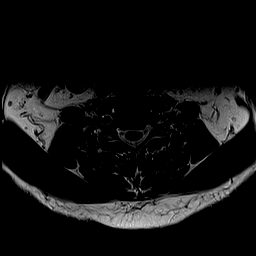
[im 14/27]
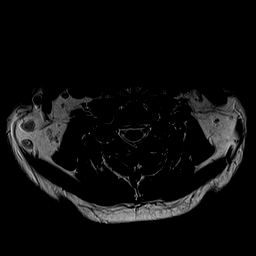
[im 16/27]
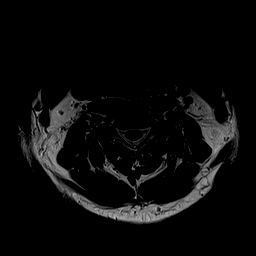
[im 18/27]
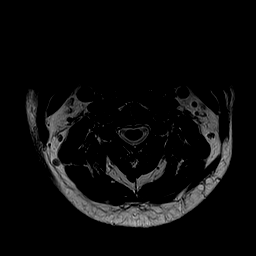
[im 22/27]
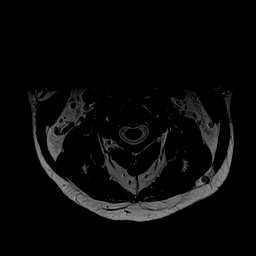
[im 27/27]
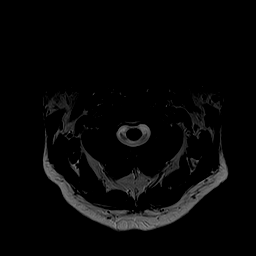

[Series 6: GRE · axial · 3.0mm · 0.35mm/px · z∈[-64,-27]mm · 4 of 27 slices shown]
[im 1/27]
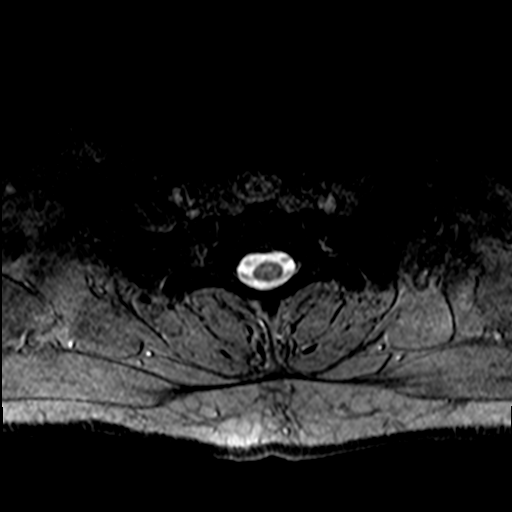
[im 5/27]
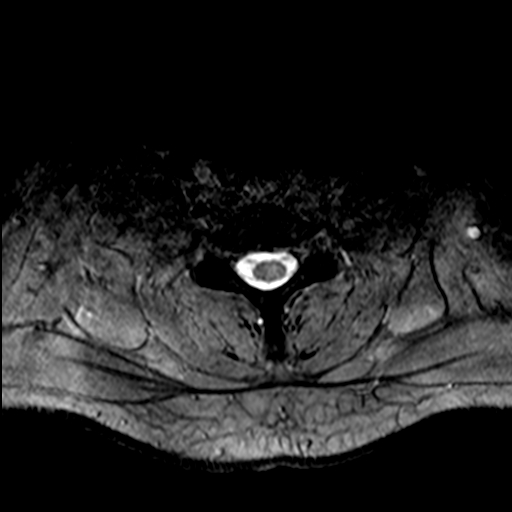
[im 9/27]
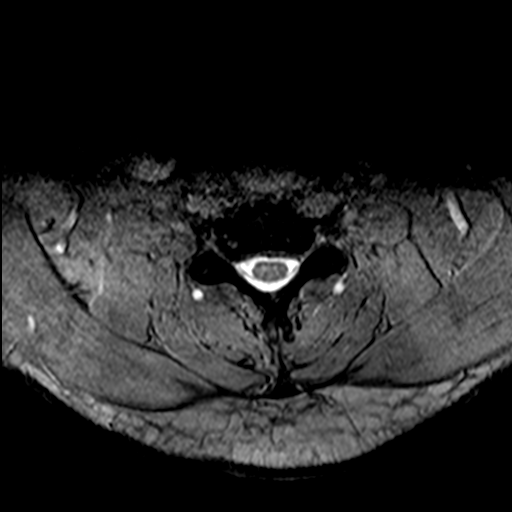
[im 11/27]
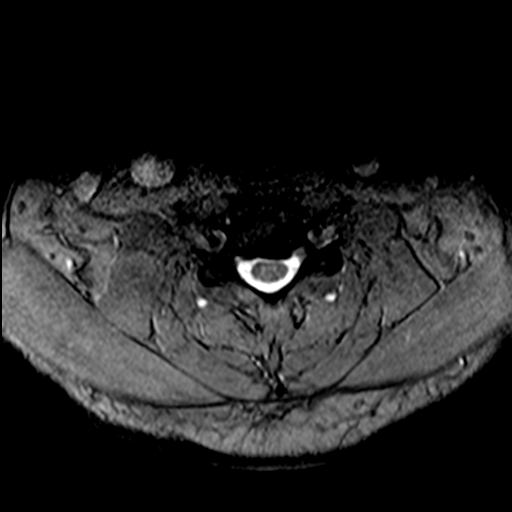

[35 of 48 positions shown; findings below may reference images not displayed]

FINDINGS: Alignment: Normal alignment with straightening of the cervical
lordosis

Vertebrae: Negative for fracture or mass. Degenerative bone marrow
edema at C5-6.

Cord: Normal signal and morphology.

Posterior Fossa, vertebral arteries, paraspinal tissues: Negative

Disc levels:

C1-2: Pannus posterior to the dens likely due to degenerative
arthropathy. No significant stenosis

C2-3: Negative

C3-4: Shallow central disc protrusion touching the cord. No
significant spinal or foraminal stenosis.

C4-5: Disc degeneration with diffuse uncinate spurring. Mild
foraminal narrowing bilaterally

C5-6: Disc degeneration with diffuse uncinate spurring. Mild right
foraminal encroachment.

C6-7: Disc degeneration with diffuse uncinate spurring. Moderate
foraminal encroachment bilaterally. Spinal canal adequate in size

C7-T1: Mild degenerative change.  Negative for stenosis.
IMPRESSION: Multilevel cervical spondylosis. Degenerative spurring and foraminal
stenosis bilaterally most prominent at C6-7.

## 2020-08-16 ENCOUNTER — Telehealth: Payer: Self-pay

## 2020-08-16 DIAGNOSIS — R269 Unspecified abnormalities of gait and mobility: Secondary | ICD-10-CM

## 2020-08-16 NOTE — Telephone Encounter (Signed)
-----   Message from Drema Dallas, DO sent at 08/16/2020 12:14 PM EDT ----- MRI of cervical spine shows no cause for balance problems.  I would recommend EMG of lower extremities to test the nerves - it involves receiving small shocks (like a static shock) and being stuck with a very thin needle.  If agreeable, I would order for bilateral lower extremities for balance disorder.

## 2020-08-16 NOTE — Telephone Encounter (Signed)
Pt advised of MRI results.  Pt agrees to scheduling a EMG of BLLE

## 2020-08-17 ENCOUNTER — Other Ambulatory Visit: Payer: 59

## 2020-08-29 ENCOUNTER — Ambulatory Visit (INDEPENDENT_AMBULATORY_CARE_PROVIDER_SITE_OTHER): Payer: 59 | Admitting: Neurology

## 2020-08-29 ENCOUNTER — Other Ambulatory Visit: Payer: Self-pay

## 2020-08-29 DIAGNOSIS — R2681 Unsteadiness on feet: Secondary | ICD-10-CM

## 2020-08-29 DIAGNOSIS — R269 Unspecified abnormalities of gait and mobility: Secondary | ICD-10-CM | POA: Diagnosis not present

## 2020-08-29 NOTE — Procedures (Signed)
[facility]  [address] [place], [address]  [place], [place] [address] Tel: [phone] Fax:  [phone] Test Date:  08/29/2020  Patient: David Gill DOB: May 27, 1960 Physician: Nita Sickle, DO  Sex: Male Height: 5\' 6"  Ref Phys: , D.O.  ID#: Shon Millet   Technician:    Patient Complaints: This is a 60 year old man referred for evaluation gait imbalance.  NCV & EMG Findings: Electrodiagnostic testing of the right lower extremity and additional studies of the left shows: Bilateral sural and superficial peroneal sensory responses are within normal limits. Bilateral peroneal and tibial motor responses are within normal limits. Bilateral tibial H reflex studies are within normal limits. There is no evidence of active or chronic motor axonal changes affecting any of the tested muscles.  Motor unit configuration and recruitment pattern is within normal limits.  Impression: This is a normal study of the lower extremities.  In particular, there is no evidence of a sensorimotor polyneuropathy or lumbosacral radiculopathy.   ___________________________ 60, DO    Nerve Conduction Studies Anti Sensory Summary Table   Stim Site NR Peak (ms) Norm Peak (ms) P-T Amp (V) Norm P-T Amp  Left Sup Peroneal Anti Sensory (Ant Lat Mall)  32C  12 cm    2.5 <4.6 16.7 >4  Right Sup Peroneal Anti Sensory (Ant Lat Mall)  32C  12 cm    2.3 <4.6 14.1 >4  Left Sural Anti Sensory (Lat Mall)  32C  Calf    2.3 <4.6 15.1 >4  Right Sural Anti Sensory (Lat Mall)  32C  Calf    2.6 <4.6 13.1 >4   Motor Summary Table   Stim Site NR Onset (ms) Norm Onset (ms) O-P Amp (mV) Norm O-P Amp Site1 Site2 Delta-0 (ms) Dist (cm) Vel (m/s) Norm Vel (m/s)  Left Peroneal Motor (Ext Dig Brev)  32C  Ankle    3.8 <6.0 4.1 >2.5 B Fib Ankle 6.6 31.0 47 >40  B Fib    10.4  3.2  Poplt B Fib 1.6 8.0 50 >40  Poplt    12.0  3.0         Right Peroneal Motor (Ext Dig Brev)  32C  Ankle     3.0 <6.0 4.8 >2.5 B Fib Ankle 6.9 34.0 49 >40  B Fib    9.9  4.7  Poplt B Fib 1.5 8.0 53 >40  Poplt    11.4  4.5         Left Tibial Motor (Abd Hall Brev)  32C  Ankle    4.1 <6.0 11.3 >4 Knee Ankle 7.7 36.0 47 >40  Knee    11.8  9.0         Right Tibial Motor (Abd Hall Brev)  32C  Ankle    3.4 <6.0 8.8 >4 Knee Ankle 7.5 36.0 48 >40  Knee    10.9  6.7          H Reflex Studies   NR H-Lat (ms) Lat Norm (ms) L-R H-Lat (ms)  Left Tibial (Gastroc)  32C     33.06 <35 1.90  Right Tibial (Gastroc)  32C     31.16 <35 1.90   EMG   Side Muscle Ins Act Fibs Psw Fasc Number Recrt Dur Dur. Amp Amp. Poly Poly. Comment  Right AntTibialis Nml Nml Nml Nml Nml Nml Nml Nml Nml Nml Nml Nml N/A  Right Gastroc Nml Nml Nml Nml Nml Nml Nml Nml Nml Nml Nml Nml N/A  Right  Flex Dig Long Nml Nml Nml Nml Nml Nml Nml Nml Nml Nml Nml Nml N/A  Right RectFemoris Nml Nml Nml Nml Nml Nml Nml Nml Nml Nml Nml Nml N/A  Left BicepsFemS Nml Nml Nml Nml Nml Nml Nml Nml Nml Nml Nml Nml N/A  Right BicepsFemS Nml Nml Nml Nml Nml Nml Nml Nml Nml Nml Nml Nml N/A  Left AntTibialis Nml Nml Nml Nml Nml Nml Nml Nml Nml Nml Nml Nml N/A  Left Gastroc Nml Nml Nml Nml Nml Nml Nml Nml Nml Nml Nml Nml N/A  Left Flex Dig Long Nml Nml Nml Nml Nml Nml Nml Nml Nml Nml Nml Nml N/A  Left RectFemoris Nml Nml Nml Nml Nml Nml Nml Nml Nml Nml Nml Nml N/A      Waveforms:

## 2020-09-20 ENCOUNTER — Encounter: Payer: 59 | Admitting: Neurology

## 2020-09-28 LAB — HM DIABETES EYE EXAM

## 2020-10-06 ENCOUNTER — Encounter: Payer: Self-pay | Admitting: Endocrinology

## 2020-10-19 ENCOUNTER — Encounter: Payer: Self-pay | Admitting: Neurology

## 2020-12-13 ENCOUNTER — Ambulatory Visit (INDEPENDENT_AMBULATORY_CARE_PROVIDER_SITE_OTHER): Payer: 59 | Admitting: Endocrinology

## 2020-12-13 ENCOUNTER — Other Ambulatory Visit: Payer: Self-pay

## 2020-12-13 ENCOUNTER — Encounter: Payer: Self-pay | Admitting: Endocrinology

## 2020-12-13 VITALS — BP 140/60 | HR 83 | Ht 65.6 in | Wt 186.8 lb

## 2020-12-13 DIAGNOSIS — E119 Type 2 diabetes mellitus without complications: Secondary | ICD-10-CM | POA: Diagnosis not present

## 2020-12-13 DIAGNOSIS — R269 Unspecified abnormalities of gait and mobility: Secondary | ICD-10-CM

## 2020-12-13 LAB — POCT GLYCOSYLATED HEMOGLOBIN (HGB A1C): Hemoglobin A1C: 5.8 % — AB (ref 4.0–5.6)

## 2020-12-13 NOTE — Patient Instructions (Addendum)
check your blood sugar once a day.  vary the time of day when you check, between before the 3 meals, and at bedtime.  also check if you have symptoms of your blood sugar being too high or too low.  please keep a record of the readings and bring it to your next appointment here (or you can bring the meter itself).  You can write it on any piece of paper.  please call us sooner if your blood sugar goes below 70, or if most of your readings are over 200. Please continue the same Januvia.   Blood tests are requested for you today.  We'll let you know about the results.   Please come back for a follow-up appointment in 4-5 months.

## 2020-12-13 NOTE — Progress Notes (Signed)
Subjective:    Patient ID: David Gill, male    DOB: [birth year], 60 y.o.   MRN: [id number]  HPI Pt returns for f/u of diabetes mellitus:  DM type: 2 Dx'ed: 2022 Complications: PN and stage 3 CRI Therapy: Januvia DKA: never Severe hypoglycemia: never Pancreatitis: never Pancreatic imaging: normal on 2021 CT SDOH: none Other: he could add metformin if necessary Interval history: He takes Janumet as rx'ed.  Instead, he takes just Januvia 50/d.  Meter is downloaded today, and the printout is scanned into the record.  Cbg varies from 80-170, but most are approx 100.  All are checked in the evening. Pt also has hypercalcemia (dx'ed 2022; PTH is low-normal; he does not take Vit-D). he is off HCTZ now).   Past Medical History:  Diagnosis Date   Abnormal gait 03/21/2020   Adjustment disorder with emotional disturbance 08/13/2019   Formatting of this note might be different from the original. 2021   Allergic rhinitis 10/14/2019   Anxiety 10/23/2019   Atrophic kidney 10/24/2019   Formatting of this note might be different from the original. 2021:  CT right hydronephrosis   Back pain    Body mass index (BMI) 32.0-32.9, adult 02/10/2020   Diabetes mellitus due to underlying condition with unspecified complications (HCC) 07/18/2020   Diabetes mellitus without complication (HCC)    Dizziness 10/23/2019   Essential hypertension 10/23/2019   Exotropia, left eye 09/13/2015   Generalized abdominal pain 11/23/2019   Formatting of this note might be different from the original. 11/23/2019:   History of tobacco abuse    Hypercalcemia 06/07/2020   Hyperglycemia    Hypertension    Hypertriglyceridemia 04/07/2020   Hypokalemia 10/23/2019   Insomnia    Left lumbar radiculitis 02/23/2019   Formatting of this note might be different from the original. 2021: MRI DDD with impingement   Low testosterone level in male    Macular scar of both eyes 09/13/2015   Metabolic acidosis with increased anion gap and  accumulation of organic acids 10/23/2019   Osteoarthritis 10/23/2019   Renal insufficiency 06/14/2020   SIRS (systemic inflammatory response syndrome) (HCC) 10/23/2019   Tachycardia 10/23/2019    Past Surgical History:  Procedure Laterality Date   TONSILLECTOMY      Social History   Socioeconomic History   Marital status: Married    Spouse name: Not on file   Number of children: Not on file   Years of education: Not on file   Highest education level: Not on file  Occupational History   Not on file  Tobacco Use   Smoking status: Former    Types: Cigarettes   Smokeless tobacco: Never  Substance and Sexual Activity   Alcohol use: Yes    Comment: seldom   Drug use: Never   Sexual activity: Yes  Other Topics Concern   Not on file  Social History Narrative   Left handed   Social Determinants of Health   Financial Resource Strain: Not on file  Food Insecurity: Not on file  Transportation Needs: Not on file  Physical Activity: Not on file  Stress: Not on file  Social Connections: Not on file  Intimate Partner Violence: Not on file    Current Outpatient Medications on File Prior to Visit  Medication Sig Dispense Refill   acetaminophen (TYLENOL) 500 MG tablet Take 500 mg by mouth every 6 (six) hours as needed for moderate pain.     cetirizine (ZYRTEC) 10 MG tablet Take 10 mg by  mouth daily.     fenofibrate 160 MG tablet Take 160 mg by mouth daily.     hydrOXYzine (ATARAX/VISTARIL) 25 MG tablet Take 25 mg by mouth as needed for anxiety.     omega-3 acid ethyl esters (LOVAZA) 1 g capsule Take 2 capsules by mouth 2 (two) times daily.     rosuvastatin (CRESTOR) 10 MG tablet TAKE 1 TABLET BY MOUTH EVERY DAY 90 tablet 2   sitaGLIPtin (JANUVIA) 50 MG tablet Take 1 tablet (50 mg total) by mouth daily. 90 tablet 3   zolpidem (AMBIEN CR) 12.5 MG CR tablet Take 12.5 mg by mouth at bedtime as needed for sleep.     diltiazem (CARDIZEM CD) 240 MG 24 hr capsule Take 1 capsule (240 mg  total) by mouth daily. 90 capsule 3   fluticasone (FLONASE) 50 MCG/ACT nasal spray Place 2 sprays into both nostrils daily. 16 g 0   hydrochlorothiazide (HYDRODIURIL) 12.5 MG tablet Take 12.5 mg by mouth daily.     No current facility-administered medications on file prior to visit.    Allergies  Allergen Reactions   Sulfamethoxazole Hives   Triamcinolone Acetonide Hives    Family History  Problem Relation Age of Onset   Colon cancer Mother    Heart attack Mother    CAD Mother    Hypertension Father    Diabetes Sister    Diabetes Brother    Breast cancer Maternal Grandmother    Stroke Maternal Grandfather    Diabetes Paternal Grandmother    CAD Paternal Grandfather     BP 140/60 (BP Location: Right Arm, Patient Position: Sitting, Cuff Size: Normal)   Pulse 83   Ht 5' 5.6" (1.666 m)   Wt 186 lb 12.8 oz (84.7 kg)   SpO2 97%   BMI 30.52 kg/m    Review of Systems He has slight unsteadiness on the feet.      Objective:   Physical Exam    Lab Results  Component Value Date   HGBA1C 5.8 (A) 12/13/2020      Assessment & Plan:  Type 2 DM: well-controlled CRI: check fructosamine and BMET.   Unsteady gait: check B-12  Patient Instructions  check your blood sugar once a day.  vary the time of day when you check, between before the 3 meals, and at bedtime.  also check if you have symptoms of your blood sugar being too high or too low.  please keep a record of the readings and bring it to your next appointment here (or you can bring the meter itself).  You can write it on any piece of paper.  please call us sooner if your blood sugar goes below 70, or if most of your readings are over 200. Please continue the same Januvia.   Blood tests are requested for you today.  We'll let you know about the results.   Please come back for a follow-up appointment in 4-5 months.

## 2020-12-14 LAB — BASIC METABOLIC PANEL
BUN: 14 mg/dL (ref 6–23)
CO2: 28 mEq/L (ref 19–32)
Calcium: 10.1 mg/dL (ref 8.4–10.5)
Chloride: 102 mEq/L (ref 96–112)
Creatinine, Ser: 1.37 mg/dL (ref 0.40–1.50)
GFR: 56.98 mL/min — ABNORMAL LOW (ref >60.00)
Glucose, Bld: 83 mg/dL (ref 70–99)
Potassium: 4.4 mEq/L (ref 3.5–5.1)
Sodium: 138 mEq/L (ref 135–145)

## 2020-12-14 LAB — VITAMIN B12: Vitamin B-12: 499 pg/mL (ref 211–911)

## 2020-12-17 LAB — FRUCTOSAMINE: Fructosamine: 247 umol/L (ref 205–285)

## 2021-01-22 ENCOUNTER — Ambulatory Visit: Payer: 59 | Admitting: Neurology

## 2021-02-01 ENCOUNTER — Ambulatory Visit: Payer: 59 | Admitting: Neurology

## 2021-02-13 DIAGNOSIS — H9193 Unspecified hearing loss, bilateral: Secondary | ICD-10-CM | POA: Diagnosis not present

## 2021-02-13 DIAGNOSIS — H509 Unspecified strabismus: Secondary | ICD-10-CM | POA: Diagnosis not present

## 2021-02-13 DIAGNOSIS — R2681 Unsteadiness on feet: Secondary | ICD-10-CM | POA: Diagnosis not present

## 2021-02-13 DIAGNOSIS — H81319 Aural vertigo, unspecified ear: Secondary | ICD-10-CM | POA: Diagnosis not present

## 2021-02-13 DIAGNOSIS — H31013 Macula scars of posterior pole (postinflammatory) (post-traumatic), bilateral: Secondary | ICD-10-CM | POA: Diagnosis not present

## 2021-02-13 DIAGNOSIS — E781 Pure hyperglyceridemia: Secondary | ICD-10-CM | POA: Diagnosis not present

## 2021-02-13 DIAGNOSIS — I10 Essential (primary) hypertension: Secondary | ICD-10-CM | POA: Diagnosis not present

## 2021-02-13 DIAGNOSIS — E119 Type 2 diabetes mellitus without complications: Secondary | ICD-10-CM | POA: Diagnosis not present

## 2021-02-15 ENCOUNTER — Telehealth: Payer: Self-pay | Admitting: Endocrinology

## 2021-02-15 NOTE — Telephone Encounter (Signed)
MEDICATION: sitaGLIPtin (JANUVIA) 50 MG tablet  PHARMACY:   [id number] - [place], [place] - [address] Phone:  [phone]  Fax:  [phone]      HAS THE PATIENT CONTACTED THEIR PHARMACY?  yes  IS THIS A 90 DAY SUPPLY : yes  IS PATIENT OUT OF MEDICATION: yes  IF NOT; HOW MUCH IS LEFT:   LAST APPOINTMENT DATE: @11 /10/2020  NEXT APPOINTMENT DATE:@3 /10/2021  DO WE HAVE YOUR PERMISSION TO LEAVE A DETAILED MESSAGE?:  OTHER COMMENTS: Insurance company must receive prior approval for medication to be filled. INS Co phone is [phone].   **Let patient know to contact pharmacy at the end of the day to make sure medication is ready. **  ** Please notify patient to allow 48-72 hours to process**  **Encourage patient to contact the pharmacy for refills or they can request refills through [facility]**

## 2021-02-16 ENCOUNTER — Other Ambulatory Visit (HOSPITAL_COMMUNITY): Payer: Self-pay

## 2021-02-16 ENCOUNTER — Telehealth: Payer: Self-pay

## 2021-02-16 NOTE — Telephone Encounter (Signed)
Patient Advocate Encounter   Received notification from patient calls that prior authorization for Januvia 50mg  tabs is required by his/her insurance [employer].   PA submitted on 02/16/21  Key#: [id number]  Status is pending    [facility] will continue to follow:  Patient Advocate Fax: [phone]

## 2021-02-16 NOTE — Telephone Encounter (Signed)
Patient Advocate Encounter  Prior Authorization for Januvia 50mg  tabs has been approved.    PA#  Effective dates: 02/16/21 through 02/16/22  Per Test Claim Patients co-pay is $15   Spoke with Pharmacy to Process.  Patient Advocate Fax: [phone]

## 2021-03-12 DIAGNOSIS — R69 Illness, unspecified: Secondary | ICD-10-CM | POA: Diagnosis not present

## 2021-03-12 DIAGNOSIS — R42 Dizziness and giddiness: Secondary | ICD-10-CM | POA: Diagnosis not present

## 2021-03-15 DIAGNOSIS — R42 Dizziness and giddiness: Secondary | ICD-10-CM | POA: Diagnosis not present

## 2021-03-15 DIAGNOSIS — E1165 Type 2 diabetes mellitus with hyperglycemia: Secondary | ICD-10-CM | POA: Diagnosis not present

## 2021-03-15 DIAGNOSIS — I10 Essential (primary) hypertension: Secondary | ICD-10-CM | POA: Diagnosis not present

## 2021-03-21 DIAGNOSIS — R42 Dizziness and giddiness: Secondary | ICD-10-CM | POA: Diagnosis not present

## 2021-04-04 DIAGNOSIS — R42 Dizziness and giddiness: Secondary | ICD-10-CM | POA: Diagnosis not present

## 2021-04-12 ENCOUNTER — Other Ambulatory Visit: Payer: Self-pay

## 2021-04-12 ENCOUNTER — Ambulatory Visit: Payer: 59 | Admitting: Endocrinology

## 2021-04-12 VITALS — BP 156/74 | HR 80 | Ht 65.6 in | Wt 187.4 lb

## 2021-04-12 DIAGNOSIS — E119 Type 2 diabetes mellitus without complications: Secondary | ICD-10-CM

## 2021-04-12 LAB — POCT GLYCOSYLATED HEMOGLOBIN (HGB A1C): Hemoglobin A1C: 5.9 % — AB (ref 4.0–5.6)

## 2021-04-12 NOTE — Progress Notes (Signed)
? ?Subjective:  ? ? Patient ID: David Gill, male    DOB: Dec 07, 1960, 61 y.o.   MRN: [id number] ? ?HPI ?Pt returns for f/u of diabetes mellitus:  ?DM type: 2 ?Dx'ed: 2022 ?Complications: PN and stage 3 CRI.   ?Therapy: Januvia ?DKA: never ?Severe hypoglycemia: never ?Pancreatitis: never ?Pancreatic imaging: normal on 2021 CT ?SDOH: he is disabled, due to PPPD ?Other: he has never been on insulin.   ?Interval history: pt says CBG varies from 74-214, but most are approx 100.   ?Pt also has hypercalcemia (dx'ed 2022; PTH is low-normal; he does not take Vit-D); off HCTZ, f/u Ca++ was normal).   ?Past Medical History:  ?Diagnosis Date  ? Abnormal gait 03/21/2020  ? Adjustment disorder with emotional disturbance 08/13/2019  ? Formatting of this note might be different from the original. 2021  ? Allergic rhinitis 10/14/2019  ? Anxiety 10/23/2019  ? Atrophic kidney 10/24/2019  ? Formatting of this note might be different from the original. 2021:  CT right hydronephrosis  ? Back pain   ? Body mass index (BMI) 32.0-32.9, adult 02/10/2020  ? Diabetes mellitus due to underlying condition with unspecified complications ([place]) [id number]  ? Diabetes mellitus without complication ([place])   ? Dizziness 10/23/2019  ? Essential hypertension 10/23/2019  ? Exotropia, left eye 09/13/2015  ? Generalized abdominal pain 11/23/2019  ? Formatting of this note might be different from the original. 11/23/2019:  ? History of tobacco abuse   ? Hypercalcemia 06/07/2020  ? Hyperglycemia   ? Hypertension   ? Hypertriglyceridemia 04/07/2020  ? Hypokalemia 10/23/2019  ? Insomnia   ? Left lumbar radiculitis 02/23/2019  ? Formatting of this note might be different from the original. 2021: MRI DDD with impingement  ? Low testosterone level in male   ? Macular scar of both eyes 09/13/2015  ? Metabolic acidosis with increased anion gap and accumulation of organic acids 10/23/2019  ? Osteoarthritis 10/23/2019  ? Renal insufficiency 06/14/2020  ? SIRS (systemic inflammatory  response syndrome) ([place]) 10/23/2019  ? Tachycardia 10/23/2019  ? ? ?Past Surgical History:  ?Procedure Laterality Date  ? TONSILLECTOMY    ? ? ?Social History  ? ?Socioeconomic History  ? Marital status: Married  ?  Spouse name: Not on file  ? Number of children: Not on file  ? Years of education: Not on file  ? Highest education level: Not on file  ?Occupational History  ? Not on file  ?Tobacco Use  ? Smoking status: Former  ?  Types: Cigarettes  ? Smokeless tobacco: Never  ?Substance and Sexual Activity  ? Alcohol use: Yes  ?  Comment: seldom  ? Drug use: Never  ? Sexual activity: Yes  ?Other Topics Concern  ? Not on file  ?Social History Narrative  ? Left handed  ? ?Social Determinants of Health  ? ?Financial Resource Strain: Not on file  ?Food Insecurity: Not on file  ?Transportation Needs: Not on file  ?Physical Activity: Not on file  ?Stress: Not on file  ?Social Connections: Not on file  ?Intimate Partner Violence: Not on file  ? ? ?Current Outpatient Medications on File Prior to Visit  ?Medication Sig Dispense Refill  ? acetaminophen (TYLENOL) 500 MG tablet Take 500 mg by mouth every 6 (six) hours as needed for moderate pain.    ? cetirizine (ZYRTEC) 10 MG tablet Take 10 mg by mouth daily.    ? fenofibrate 160 MG tablet Take 160 mg by mouth daily.    ?  omega-3 acid ethyl esters (LOVAZA) 1 g capsule Take 2 capsules by mouth 2 (two) times daily.    ? rosuvastatin (CRESTOR) 10 MG tablet TAKE 1 TABLET BY MOUTH EVERY DAY 90 tablet 2  ? sitaGLIPtin (JANUVIA) 50 MG tablet Take 1 tablet (50 mg total) by mouth daily. 90 tablet 3  ? zolpidem (AMBIEN CR) 12.5 MG CR tablet Take 12.5 mg by mouth at bedtime as needed for sleep.    ? diltiazem (CARDIZEM CD) 240 MG 24 hr capsule Take 1 capsule (240 mg total) by mouth daily. 90 capsule 3  ? fluticasone (FLONASE) 50 MCG/ACT nasal spray Place 2 sprays into both nostrils daily. 16 g 0  ? hydrOXYzine (ATARAX/VISTARIL) 25 MG tablet Take 25 mg by mouth as needed for anxiety.     ? ?No current facility-administered medications on file prior to visit.  ? ? ?Allergies  ?Allergen Reactions  ? Sulfamethoxazole Hives  ? Triamcinolone Acetonide Hives  ? ? ?Family History  ?Problem Relation Age of Onset  ? Colon cancer Mother   ? Heart attack Mother   ? CAD Mother   ? Hypertension Father   ? Diabetes Sister   ? Diabetes Brother   ? Breast cancer Maternal Grandmother   ? Stroke Maternal Grandfather   ? Diabetes Paternal Grandmother   ? CAD Paternal Grandfather   ? ? ?BP (!) 156/74   Pulse 80   Ht 5' 5.6" (1.666 m)   Wt 187 lb 6.4 oz (85 kg)   SpO2 98%   BMI 30.62 kg/m?  ? ? ?Review of Systems ? ?   ?Objective:  ? Physical Exam ?VITAL SIGNS:  See vs page ?GENERAL: no distress ?EXT: no leg edema.   ? ?Lab Results  ?Component Value Date  ? PTH 22 06/07/2020  ? CALCIUM 10.1 12/13/2020  ? ? ?Lab Results  ?Component Value Date  ? CREATININE 1.37 12/13/2020  ? BUN 14 12/13/2020  ? NA 138 12/13/2020  ? K 4.4 12/13/2020  ? CL 102 12/13/2020  ? CO2 28 12/13/2020  ? ? ?Lab Results  ?Component Value Date  ? HGBA1C 5.9 (A) 04/12/2021  ? ?   ?Assessment & Plan:  ?Type 2 DM: well-controlled ? ?Patient Instructions  ?check your blood sugar once a day.  vary the time of day when you check, between before the 3 meals, and at bedtime.  also check if you have symptoms of your blood sugar being too high or too low.  please keep a record of the readings and bring it to your next appointment here (or you can bring the meter itself).  You can write it on any piece of paper.  please call us sooner if your blood sugar goes below 70, or if most of your readings are over 200.   ?Please continue the same Januvia.   ?Please come back for a follow-up appointment in 6 months.   ? ? ?

## 2021-04-12 NOTE — Patient Instructions (Addendum)
check your blood sugar once a day.  vary the time of day when you check, between before the 3 meals, and at bedtime.  also check if you have symptoms of your blood sugar being too high or too low.  please keep a record of the readings and bring it to your next appointment here (or you can bring the meter itself).  You can write it on any piece of paper.  please call us sooner if your blood sugar goes below 70, or if most of your readings are over 200.   ?Please continue the same Januvia.   ?Please come back for a follow-up appointment in 6 months.   ?

## 2021-04-20 ENCOUNTER — Ambulatory Visit: Payer: 59 | Admitting: Cardiology

## 2021-04-26 DIAGNOSIS — K2100 Gastro-esophageal reflux disease with esophagitis, without bleeding: Secondary | ICD-10-CM | POA: Diagnosis not present

## 2021-04-26 DIAGNOSIS — E663 Overweight: Secondary | ICD-10-CM | POA: Diagnosis not present

## 2021-04-26 DIAGNOSIS — Z6829 Body mass index (BMI) 29.0-29.9, adult: Secondary | ICD-10-CM | POA: Diagnosis not present

## 2021-06-02 ENCOUNTER — Other Ambulatory Visit: Payer: Self-pay | Admitting: Cardiology

## 2021-06-02 DIAGNOSIS — E781 Pure hyperglyceridemia: Secondary | ICD-10-CM

## 2021-06-26 ENCOUNTER — Ambulatory Visit: Payer: 59 | Admitting: Cardiology

## 2021-06-27 ENCOUNTER — Other Ambulatory Visit: Payer: Self-pay | Admitting: Cardiology

## 2021-06-27 DIAGNOSIS — E781 Pure hyperglyceridemia: Secondary | ICD-10-CM

## 2021-06-27 NOTE — Telephone Encounter (Signed)
Rx sent to pharmacy   

## 2021-07-17 ENCOUNTER — Other Ambulatory Visit: Payer: Self-pay

## 2021-07-19 ENCOUNTER — Encounter: Payer: Self-pay | Admitting: Cardiology

## 2021-07-19 ENCOUNTER — Ambulatory Visit: Payer: 59 | Admitting: Cardiology

## 2021-07-19 VITALS — BP 146/76 | HR 65 | Ht 65.5 in | Wt 182.6 lb

## 2021-07-19 DIAGNOSIS — I10 Essential (primary) hypertension: Secondary | ICD-10-CM | POA: Diagnosis not present

## 2021-07-19 DIAGNOSIS — E781 Pure hyperglyceridemia: Secondary | ICD-10-CM | POA: Diagnosis not present

## 2021-07-19 DIAGNOSIS — E088 Diabetes mellitus due to underlying condition with unspecified complications: Secondary | ICD-10-CM | POA: Diagnosis not present

## 2021-07-19 NOTE — Patient Instructions (Signed)

## 2021-07-19 NOTE — Progress Notes (Signed)
Cardiology Office Note:    Date:  07/19/2021   ID:  David Gill, DOB 1960-08-10, MRN [id number]  PCP:  Assunta Found, MD  Cardiologist:  Garwin Brothers, MD   Referring MD: Assunta Found, MD    ASSESSMENT:    1. Essential hypertension   2. Hypertriglyceridemia   3. Diabetes mellitus due to underlying condition with unspecified complications (HCC)    PLAN:    In order of problems listed above:  Primary prevention stressed with the patient.  Importance of compliance with diet and medication stressed and he vocalized understanding. Essential hypertension: Blood pressure stable and diet was emphasized.  Lifestyle modification urged.  Blood pressure stable. Mixed dyslipidemia: On lipid-lowering medications and future value.  Lipids were reviewed from [facility] sheet diet was emphasized.  He was advised diet low in carbs and fried fatty foods in view of elevated triglycerides. Diabetes mellitus: Managed by primary care.  Hemoglobin A1c is fine. He was advised to walk at least half an hour a day 5 days a week and he promises to do so. Patient will be seen in follow-up appointment in 6 months or earlier if the patient has any concerns    Medication Adjustments/Labs and Tests Ordered: Current medicines are reviewed at length with the patient today.  Concerns regarding medicines are outlined above.  No orders of the defined types were placed in this encounter.  No orders of the defined types were placed in this encounter.    Chief Complaint  Patient presents with   Follow-up     History of Present Illness:    David Gill is a 61 y.o. male.  Patient has past medical history of essential hypertension, dyslipidemia and diabetes mellitus.  He denies any problems at this time patient is correct.  Patient is weak.  No chest pain orthopnea or PND.  Patient mentions to me that he has good blood pressures at home and keeps a track of them.  He mentioned to be the numbers at  the local.  He has an element of whitecoat hypertension.  At the time of my evaluation, the patient is alert awake oriented and in no distress.  Past Medical History:  Diagnosis Date   Abnormal gait 03/21/2020   Adjustment disorder with emotional disturbance 08/13/2019   Formatting of this note might be different from the original. 2021   Allergic rhinitis 10/14/2019   Anxiety 10/23/2019   Atrophic kidney 10/24/2019   Formatting of this note might be different from the original. 2021:  CT right hydronephrosis   Back pain    Body mass index (BMI) 32.0-32.9, adult 02/10/2020   Diabetes mellitus due to underlying condition with unspecified complications (HCC) 07/18/2020   Diabetes mellitus without complication (HCC)    Dizziness 10/23/2019   Essential hypertension 10/23/2019   Exotropia, left eye 09/13/2015   Generalized abdominal pain 11/23/2019   Formatting of this note might be different from the original. 11/23/2019:   History of tobacco abuse    Hypercalcemia 06/07/2020   Hyperglycemia    Hypertension    Hypertriglyceridemia 04/07/2020   Hypokalemia 10/23/2019   Insomnia    Left lumbar radiculitis 02/23/2019   Formatting of this note might be different from the original. 2021: MRI DDD with impingement   Low testosterone level in male    Macular scar of both eyes 09/13/2015   Metabolic acidosis with increased anion gap and accumulation of organic acids 10/23/2019   Osteoarthritis 10/23/2019   Pre-existing essential htn comp  pregnancy, unsp trimester 10/23/2019   Renal insufficiency 06/14/2020   SIRS (systemic inflammatory response syndrome) (HCC) 10/23/2019   Tachycardia 10/23/2019    Past Surgical History:  Procedure Laterality Date   TONSILLECTOMY      Current Medications: Current Meds  Medication Sig   acetaminophen (TYLENOL) 500 MG tablet Take 500 mg by mouth every 6 (six) hours as needed for moderate pain.   cetirizine (ZYRTEC) 10 MG tablet Take 10 mg by mouth daily.   diltiazem (CARDIZEM  CD) 240 MG 24 hr capsule TAKE 1 CAPSULE BY MOUTH EVERY DAY (Patient taking differently: Take 240 mg by mouth daily.)   fenofibrate 160 MG tablet Take 160 mg by mouth daily.   omega-3 acid ethyl esters (LOVAZA) 1 g capsule Take 2 capsules by mouth 2 (two) times daily.   rosuvastatin (CRESTOR) 10 MG tablet TAKE 1 TABLET BY MOUTH EVERY DAY (Patient taking differently: Take 10 mg by mouth daily.)   sertraline (ZOLOFT) 50 MG tablet Take 50 mg by mouth daily.   sitaGLIPtin (JANUVIA) 50 MG tablet Take 1 tablet (50 mg total) by mouth daily.   zolpidem (AMBIEN CR) 12.5 MG CR tablet Take 12.5 mg by mouth at bedtime as needed for sleep.     Allergies:   Sulfamethoxazole and Triamcinolone acetonide   Social History   Socioeconomic History   Marital status: Married    Spouse name: Not on file   Number of children: Not on file   Years of education: Not on file   Highest education level: Not on file  Occupational History   Not on file  Tobacco Use   Smoking status: Former    Types: Cigarettes   Smokeless tobacco: Never  Substance and Sexual Activity   Alcohol use: Yes    Comment: seldom   Drug use: Never   Sexual activity: Yes  Other Topics Concern   Not on file  Social History Narrative   Left handed   Social Determinants of Health   Financial Resource Strain: Not on file  Food Insecurity: Not on file  Transportation Needs: Not on file  Physical Activity: Not on file  Stress: Not on file  Social Connections: Not on file     Family History: The patient's family history includes Breast cancer in his maternal grandmother; CAD in his mother and paternal grandfather; Colon cancer in his mother; Diabetes in his brother, paternal grandmother, and sister; Heart attack in his mother; Hypertension in his father; Stroke in his maternal grandfather.  ROS:   Please see the history of present illness.    All other systems reviewed and are negative.  EKGs/Labs/Other Studies Reviewed:    The  following studies were reviewed today: I discussed my findings with the patient at length EKG revealed sinus rhythm with nonspecific ST-T changes   Recent Labs: 12/13/2020: BUN 14; Creatinine, Ser 1.37; Potassium 4.4; Sodium 138  Recent Lipid Panel    Component Value Date/Time   CHOL 131 05/12/2020 0806   TRIG 179 (H) 05/12/2020 0806   HDL 32 (L) 05/12/2020 0806   CHOLHDL 4.1 05/12/2020 0806   LDLCALC 69 05/12/2020 0806    Physical Exam:    VS:  BP (!) 146/76 (BP Location: Left Arm)   Pulse 65   Ht 5' 5.5" (1.664 m)   Wt 182 lb 9.6 oz (82.8 kg)   SpO2 97%   BMI 29.92 kg/m     Wt Readings from Last 3 Encounters:  07/19/21 182 lb 9.6 oz (82.8  kg)  04/12/21 187 lb 6.4 oz (85 kg)  12/13/20 186 lb 12.8 oz (84.7 kg)     GEN: Patient is in no acute distress HEENT: Normal NECK: No JVD; No carotid bruits LYMPHATICS: No lymphadenopathy CARDIAC: Hear sounds regular, 2/6 systolic murmur at the apex. RESPIRATORY:  Clear to auscultation without rales, wheezing or rhonchi  ABDOMEN: Soft, non-tender, non-distended MUSCULOSKELETAL:  No edema; No deformity  SKIN: Warm and dry NEUROLOGIC:  Alert and oriented x 3 PSYCHIATRIC:  Normal affect   Signed, Garwin Brothers, MD  07/19/2021 2:30 PM    [facility]

## 2021-07-24 ENCOUNTER — Other Ambulatory Visit: Payer: Self-pay | Admitting: Cardiology

## 2021-07-24 DIAGNOSIS — E781 Pure hyperglyceridemia: Secondary | ICD-10-CM

## 2021-07-27 DIAGNOSIS — G4700 Insomnia, unspecified: Secondary | ICD-10-CM | POA: Diagnosis not present

## 2021-08-28 ENCOUNTER — Other Ambulatory Visit: Payer: Self-pay

## 2021-08-28 DIAGNOSIS — E119 Type 2 diabetes mellitus without complications: Secondary | ICD-10-CM

## 2021-08-28 MED ORDER — SITAGLIPTIN PHOSPHATE 50 MG PO TABS: 50.0000 mg | ORAL_TABLET | Freq: Every day | ORAL | 0 refills | Status: AC

## 2021-09-05 DIAGNOSIS — U071 COVID-19: Secondary | ICD-10-CM | POA: Diagnosis not present

## 2021-09-07 ENCOUNTER — Other Ambulatory Visit: Payer: Self-pay | Admitting: Cardiology

## 2021-10-18 DIAGNOSIS — L300 Nummular dermatitis: Secondary | ICD-10-CM | POA: Diagnosis not present

## 2021-10-18 DIAGNOSIS — L570 Actinic keratosis: Secondary | ICD-10-CM | POA: Diagnosis not present

## 2021-11-27 DIAGNOSIS — E663 Overweight: Secondary | ICD-10-CM | POA: Diagnosis not present

## 2021-11-27 DIAGNOSIS — G4700 Insomnia, unspecified: Secondary | ICD-10-CM | POA: Diagnosis not present

## 2021-11-27 DIAGNOSIS — R69 Illness, unspecified: Secondary | ICD-10-CM | POA: Diagnosis not present

## 2021-11-27 DIAGNOSIS — E1165 Type 2 diabetes mellitus with hyperglycemia: Secondary | ICD-10-CM | POA: Diagnosis not present

## 2021-11-27 DIAGNOSIS — R42 Dizziness and giddiness: Secondary | ICD-10-CM | POA: Diagnosis not present

## 2021-11-27 DIAGNOSIS — Z0001 Encounter for general adult medical examination with abnormal findings: Secondary | ICD-10-CM | POA: Diagnosis not present

## 2021-11-27 DIAGNOSIS — R002 Palpitations: Secondary | ICD-10-CM | POA: Diagnosis not present

## 2021-11-27 DIAGNOSIS — Z23 Encounter for immunization: Secondary | ICD-10-CM | POA: Diagnosis not present

## 2021-11-27 DIAGNOSIS — Z1331 Encounter for screening for depression: Secondary | ICD-10-CM | POA: Diagnosis not present

## 2021-11-27 DIAGNOSIS — N183 Chronic kidney disease, stage 3 unspecified: Secondary | ICD-10-CM | POA: Diagnosis not present

## 2021-11-27 DIAGNOSIS — I10 Essential (primary) hypertension: Secondary | ICD-10-CM | POA: Diagnosis not present

## 2022-01-21 ENCOUNTER — Other Ambulatory Visit: Payer: Self-pay | Admitting: Cardiology

## 2022-01-21 DIAGNOSIS — E781 Pure hyperglyceridemia: Secondary | ICD-10-CM

## 2022-02-03 ENCOUNTER — Other Ambulatory Visit: Payer: Self-pay | Admitting: Endocrinology

## 2022-02-03 DIAGNOSIS — E119 Type 2 diabetes mellitus without complications: Secondary | ICD-10-CM

## 2022-03-05 ENCOUNTER — Other Ambulatory Visit: Payer: Self-pay | Admitting: Cardiology

## 2022-03-06 DIAGNOSIS — G4700 Insomnia, unspecified: Secondary | ICD-10-CM | POA: Diagnosis not present

## 2022-03-06 DIAGNOSIS — E1165 Type 2 diabetes mellitus with hyperglycemia: Secondary | ICD-10-CM | POA: Diagnosis not present

## 2022-05-23 DIAGNOSIS — Z0001 Encounter for general adult medical examination with abnormal findings: Secondary | ICD-10-CM | POA: Diagnosis not present

## 2022-05-23 DIAGNOSIS — E1165 Type 2 diabetes mellitus with hyperglycemia: Secondary | ICD-10-CM | POA: Diagnosis not present

## 2022-05-23 DIAGNOSIS — N183 Chronic kidney disease, stage 3 unspecified: Secondary | ICD-10-CM | POA: Diagnosis not present

## 2022-05-23 DIAGNOSIS — Z125 Encounter for screening for malignant neoplasm of prostate: Secondary | ICD-10-CM | POA: Diagnosis not present

## 2022-05-23 DIAGNOSIS — F329 Major depressive disorder, single episode, unspecified: Secondary | ICD-10-CM | POA: Diagnosis not present

## 2022-05-23 DIAGNOSIS — Z6830 Body mass index (BMI) 30.0-30.9, adult: Secondary | ICD-10-CM | POA: Diagnosis not present

## 2022-05-23 DIAGNOSIS — R7989 Other specified abnormal findings of blood chemistry: Secondary | ICD-10-CM | POA: Diagnosis not present

## 2022-05-23 DIAGNOSIS — N529 Male erectile dysfunction, unspecified: Secondary | ICD-10-CM | POA: Diagnosis not present

## 2022-05-23 DIAGNOSIS — E6609 Other obesity due to excess calories: Secondary | ICD-10-CM | POA: Diagnosis not present

## 2022-05-23 DIAGNOSIS — G4700 Insomnia, unspecified: Secondary | ICD-10-CM | POA: Diagnosis not present

## 2022-05-23 DIAGNOSIS — R42 Dizziness and giddiness: Secondary | ICD-10-CM | POA: Diagnosis not present

## 2022-05-23 DIAGNOSIS — I10 Essential (primary) hypertension: Secondary | ICD-10-CM | POA: Diagnosis not present

## 2022-05-23 DIAGNOSIS — F419 Anxiety disorder, unspecified: Secondary | ICD-10-CM | POA: Diagnosis not present

## 2022-06-05 DIAGNOSIS — I10 Essential (primary) hypertension: Secondary | ICD-10-CM | POA: Diagnosis not present

## 2022-06-18 DIAGNOSIS — I10 Essential (primary) hypertension: Secondary | ICD-10-CM | POA: Diagnosis not present

## 2022-07-04 DIAGNOSIS — E663 Overweight: Secondary | ICD-10-CM | POA: Diagnosis not present

## 2022-07-04 DIAGNOSIS — R7989 Other specified abnormal findings of blood chemistry: Secondary | ICD-10-CM | POA: Diagnosis not present

## 2022-07-04 DIAGNOSIS — Z6829 Body mass index (BMI) 29.0-29.9, adult: Secondary | ICD-10-CM | POA: Diagnosis not present

## 2022-07-04 DIAGNOSIS — D72829 Elevated white blood cell count, unspecified: Secondary | ICD-10-CM | POA: Diagnosis not present

## 2022-07-04 DIAGNOSIS — Z0001 Encounter for general adult medical examination with abnormal findings: Secondary | ICD-10-CM | POA: Diagnosis not present

## 2022-07-04 DIAGNOSIS — Z1331 Encounter for screening for depression: Secondary | ICD-10-CM | POA: Diagnosis not present

## 2022-07-17 ENCOUNTER — Encounter: Payer: Self-pay | Admitting: Cardiology

## 2022-07-17 ENCOUNTER — Ambulatory Visit: Payer: PPO | Attending: Cardiology | Admitting: Cardiology

## 2022-07-17 VITALS — BP 150/70 | HR 76 | Ht 65.6 in | Wt 183.6 lb

## 2022-07-17 DIAGNOSIS — N289 Disorder of kidney and ureter, unspecified: Secondary | ICD-10-CM | POA: Diagnosis not present

## 2022-07-17 DIAGNOSIS — E088 Diabetes mellitus due to underlying condition with unspecified complications: Secondary | ICD-10-CM

## 2022-07-17 DIAGNOSIS — I10 Essential (primary) hypertension: Secondary | ICD-10-CM | POA: Diagnosis not present

## 2022-07-17 DIAGNOSIS — E781 Pure hyperglyceridemia: Secondary | ICD-10-CM

## 2022-07-17 NOTE — Patient Instructions (Signed)
Medication Instructions:  Your physician recommends that you continue on your current medications as directed. Please refer to the Current Medication list given to you today.  *If you need a refill on your cardiac medications before your next appointment, please call your pharmacy*   Lab Work: None ordered If you have labs (blood work) drawn today and your tests are completely normal, you will receive your results only by: MyChart Message (if you have MyChart) OR A paper copy in the mail If you have any lab test that is abnormal or we need to change your treatment, we will call you to review the results.   Testing/Procedures: None ordered   Follow-Up: At [facility], you and your health needs are our priority.  As part of our continuing mission to provide you with exceptional heart care, we have created designated Provider Care Teams.  These Care Teams include your primary Cardiologist (physician) and Advanced Practice Providers (APPs -  Physician Assistants and Nurse Practitioners) who all work together to provide you with the care you need, when you need it.  We recommend signing up for the patient portal called "MyChart".  Sign up information is provided on this After Visit Summary.  MyChart is used to connect with patients for Virtual Visits (Telemedicine).  Patients are able to view lab/test results, encounter notes, upcoming appointments, etc.  Non-urgent messages can be sent to your provider as well.   To learn more about what you can do with MyChart, go to [email or web address].    Your next appointment:   12 month(s)  The format for your next appointment:   In Person  Provider:   Rajan Revankar, MD    Other Instructions none  Important Information About Sugar      

## 2022-07-17 NOTE — Progress Notes (Signed)
Cardiology Office Note:    Date:  07/17/2022   ID:  David Gill, DOB 09/21/1960, MRN [id number]  PCP:  David Found, MD  Cardiologist:  David Brothers, MD   Referring MD: David Found, MD    ASSESSMENT:    1. Essential hypertension   2. Diabetes mellitus due to underlying condition with unspecified complications (HCC)   3. Renal insufficiency   4. Hypertriglyceridemia    PLAN:    In order of problems listed above:  Primary prevention stressed with the patient.  Importance of compliance with diet medication stressed and patient verbalized standing. Essential hypertension: Blood pressure stable and diet was emphasized.  He has brought blood pressure readings from home and they are fine.  I am a little not too keen on aggressive blood pressure lowering because of postural hypotension issues in this patient with gait problems Mixed dyslipidemia: On lipid-lowering medications.  He complains of some body ache issues at times and I have asked him to hold off on his statin for 2 weeks and let us know how he feels.  He is agreeable. Renal insufficiency: Managed by primary care.  They are monitoring this closely. Patient will be seen in follow-up appointment in 12 months or earlier if the patient has any concerns.    Medication Adjustments/Labs and Tests Ordered: Current medicines are reviewed at length with the patient today.  Concerns regarding medicines are outlined above.  No orders of the defined types were placed in this encounter.  No orders of the defined types were placed in this encounter.    No chief complaint on file.    History of Present Illness:    David Gill is a 62 y.o. male.  Patient has past medical history of essential hypertension, dyslipidemia, diabetes mellitus and renal insufficiency.  He mentions to me that he has some bodyaches at times.  He denies any chest pain orthopnea or PND.  Overall he is an active gentleman.  At the time of  my evaluation, the patient is alert awake oriented and in no distress.  Past Medical History:  Diagnosis Date   Abnormal gait 03/21/2020   Adjustment disorder with emotional disturbance 08/13/2019   Formatting of this note might be different from the original. 2021   Allergic rhinitis 10/14/2019   Anxiety 10/23/2019   Atrophic kidney 10/24/2019   Formatting of this note might be different from the original. 2021:  CT right hydronephrosis   Back pain    Body mass index (BMI) 32.0-32.9, adult 02/10/2020   Diabetes mellitus due to underlying condition with unspecified complications (HCC) 07/18/2020   Diabetes mellitus without complication (HCC)    Dizziness 10/23/2019   Essential hypertension 10/23/2019   Exotropia, left eye 09/13/2015   History of tobacco abuse    Hypercalcemia 06/07/2020   Hyperglycemia    Hypertension    Hypertriglyceridemia 04/07/2020   Hypokalemia 10/23/2019   Insomnia    Left lumbar radiculitis 02/23/2019   Formatting of this note might be different from the original. 2021: MRI DDD with impingement   Low testosterone level in male    Macular scar of both eyes 09/13/2015   Metabolic acidosis with increased anion gap and accumulation of organic acids 10/23/2019   Osteoarthritis 10/23/2019   Renal insufficiency 06/14/2020   SIRS (systemic inflammatory response syndrome) (HCC) 10/23/2019   Tachycardia 10/23/2019    Past Surgical History:  Procedure Laterality Date   TONSILLECTOMY      Current Medications: Current Meds  Medication  Sig   acetaminophen (TYLENOL) 500 MG tablet Take 500 mg by mouth every 6 (six) hours as needed for moderate pain.   cetirizine (ZYRTEC) 10 MG tablet Take 10 mg by mouth daily.   diltiazem (CARDIZEM LA) 360 MG 24 hr tablet Take 360 mg by mouth daily.   fenofibrate 160 MG tablet Take 160 mg by mouth daily.   omega-3 acid ethyl esters (LOVAZA) 1 g capsule Take 2 capsules by mouth 2 (two) times daily.   rosuvastatin (CRESTOR) 10  MG tablet TAKE 1 TABLET BY MOUTH EVERY DAY   sertraline (ZOLOFT) 100 MG tablet Take 100 mg by mouth daily.   sitaGLIPtin (JANUVIA) 50 MG tablet Take 1 tablet (50 mg total) by mouth daily.   zolpidem (AMBIEN CR) 12.5 MG CR tablet Take 12.5 mg by mouth at bedtime as needed for sleep.     Allergies:   Sulfamethoxazole and Triamcinolone acetonide   Social History   Socioeconomic History   Marital status: Married    Spouse name: Not on file   Number of children: Not on file   Years of education: Not on file   Highest education level: Not on file  Occupational History   Not on file  Tobacco Use   Smoking status: Former    Types: Cigarettes   Smokeless tobacco: Never  Substance and Sexual Activity   Alcohol use: Yes    Comment: seldom   Drug use: Never   Sexual activity: Yes  Other Topics Concern   Not on file  Social History Narrative   Left handed   Social Determinants of Health   Financial Resource Strain: Not on file  Food Insecurity: Not on file  Transportation Needs: Not on file  Physical Activity: Not on file  Stress: Not on file  Social Connections: Not on file     Family History: The patient's family history includes Breast cancer in his maternal grandmother; CAD in his mother and paternal grandfather; Colon cancer in his mother; Diabetes in his brother, paternal grandmother, and sister; Heart attack in his mother; Hypertension in his father; Stroke in his maternal grandfather.  ROS:   Please see the history of present illness.    All other systems reviewed and are negative.  EKGs/Labs/Other Studies Reviewed:    The following studies were reviewed today: I discussed my findings with the patient at length.   Recent Labs: No results Gill for requested labs within last 365 days.  Recent Lipid Panel    Component Value Date/Time   CHOL 131 05/12/2020 0806   TRIG 179 (H) 05/12/2020 0806   HDL 32 (L) 05/12/2020 0806   CHOLHDL 4.1 05/12/2020 0806   LDLCALC  69 05/12/2020 0806    Physical Exam:    VS:  BP (!) 150/70   Pulse 76   Ht 5' 5.6" (1.666 m)   Wt 183 lb 9.6 oz (83.3 kg)   SpO2 96%   BMI 30.00 kg/m     Wt Readings from Last 3 Encounters:  07/17/22 183 lb 9.6 oz (83.3 kg)  07/19/21 182 lb 9.6 oz (82.8 kg)  04/12/21 187 lb 6.4 oz (85 kg)     GEN: Patient is in no acute distress HEENT: Normal NECK: No JVD; No carotid bruits LYMPHATICS: No lymphadenopathy CARDIAC: Hear sounds regular, 2/6 systolic murmur at the apex. RESPIRATORY:  Clear to auscultation without rales, wheezing or rhonchi  ABDOMEN: Soft, non-tender, non-distended MUSCULOSKELETAL:  No edema; No deformity  SKIN: Warm and dry NEUROLOGIC:  Alert and oriented x 3 PSYCHIATRIC:  Normal affect   Signed, David Brothers, MD  07/17/2022 2:22 PM    [facility]

## 2022-10-03 DIAGNOSIS — H52203 Unspecified astigmatism, bilateral: Secondary | ICD-10-CM | POA: Diagnosis not present

## 2022-10-03 DIAGNOSIS — E119 Type 2 diabetes mellitus without complications: Secondary | ICD-10-CM | POA: Diagnosis not present

## 2022-10-03 DIAGNOSIS — H31003 Unspecified chorioretinal scars, bilateral: Secondary | ICD-10-CM | POA: Diagnosis not present

## 2022-10-03 DIAGNOSIS — H5203 Hypermetropia, bilateral: Secondary | ICD-10-CM | POA: Diagnosis not present

## 2022-11-05 DIAGNOSIS — D485 Neoplasm of uncertain behavior of skin: Secondary | ICD-10-CM | POA: Diagnosis not present

## 2022-11-05 DIAGNOSIS — L570 Actinic keratosis: Secondary | ICD-10-CM | POA: Diagnosis not present

## 2022-12-16 DIAGNOSIS — I10 Essential (primary) hypertension: Secondary | ICD-10-CM | POA: Diagnosis not present

## 2022-12-16 DIAGNOSIS — D72829 Elevated white blood cell count, unspecified: Secondary | ICD-10-CM | POA: Diagnosis not present

## 2022-12-16 DIAGNOSIS — N183 Chronic kidney disease, stage 3 unspecified: Secondary | ICD-10-CM | POA: Diagnosis not present

## 2022-12-16 DIAGNOSIS — E6609 Other obesity due to excess calories: Secondary | ICD-10-CM | POA: Diagnosis not present

## 2022-12-16 DIAGNOSIS — E782 Mixed hyperlipidemia: Secondary | ICD-10-CM | POA: Diagnosis not present

## 2022-12-16 DIAGNOSIS — E1165 Type 2 diabetes mellitus with hyperglycemia: Secondary | ICD-10-CM | POA: Diagnosis not present

## 2022-12-16 DIAGNOSIS — Z6830 Body mass index (BMI) 30.0-30.9, adult: Secondary | ICD-10-CM | POA: Diagnosis not present

## 2022-12-17 ENCOUNTER — Telehealth: Payer: Self-pay | Admitting: Cardiology

## 2022-12-17 DIAGNOSIS — E781 Pure hyperglyceridemia: Secondary | ICD-10-CM

## 2022-12-17 DIAGNOSIS — E088 Diabetes mellitus due to underlying condition with unspecified complications: Secondary | ICD-10-CM

## 2022-12-17 DIAGNOSIS — E785 Hyperlipidemia, unspecified: Secondary | ICD-10-CM

## 2022-12-17 NOTE — Telephone Encounter (Signed)
New Message:     Patient says his Cholesterol is up high again. He wants Dr Morrie Sheldon to start him on something other than the Rosuvastatin please.

## 2022-12-17 NOTE — Telephone Encounter (Signed)
They are in [facility].

## 2022-12-17 NOTE — Telephone Encounter (Signed)
Date Collected Date Completed Ordering Provider Account Name  12/16/2022 11:20 AM 12/17/2022 04:35 AM [facility] .   []  Test, Units Result Reference Interval Notes  []  Cholesterol, Total mg/dL 536 H 644 mg/dL 034   []  Triglycerides mg/dL 742 H 0 mg/dL 595   []  HDL Cholesterol mg/dL 43 39 mg/dL   []  VLDL Cholesterol Cal mg/dL 47 H 5 mg/dL 40   []  LDL Chol Calc (NIH) mg/dL 638 H 0 mg/dL 99    Clear Selections   Date Collected Date Completed Ordering Provider Account Name  12/16/2022 11:20 AM 12/17/2022 04:35 AM [facility] .   []  Test, Units Result Reference Interval Notes  []  Glucose mg/dL 756 H 70 mg/dL 99   []  BUN mg/dL 18 8 mg/dL 27   []  Creatinine mg/dL 4.33 H 2.95 mg/dL 1.88   []  eGFR mL/min/1.73 62 59 mL/min/1.73   []  BUN/Creatinine Ratio 14 10 24    []  Sodium mmol/L 141 134 mmol/L 144   []  Potassium mmol/L 4.7 3.5 mmol/L 5.2   []  Chloride mmol/L 102 96 mmol/L 106   []  Carbon Dioxide, Total mmol/L 22 20 mmol/L 29   []  Calcium mg/dL 10 8.6 mg/dL 41.6   []  Protein, Total g/dL 7.6 6 g/dL 8.5   []  Albumin g/dL 4.8 3.8 g/dL 4.9   []  Globulin, Total g/dL 2.8 1.5 g/dL 4.5   []  Bilirubin, Total mg/dL 0.4 0 mg/dL 1.2   []  Alkaline Phosphatase IU/L 70 44 IU/L 121   []  AST (SGOT) IU/L 43 H 0 IU/L 40   []  ALT (SGPT) IU/L 70 H 0 IU/L 44    Clear Selections   Date Collected Date Completed Ordering Provider Account Name  12/16/2022 11:20 AM 12/17/2022 04:35 AM [facility] .   []  Test, Units Result Reference Interval Notes  []  Glucose mg/dL 606 H 70 mg/dL 99   []  BUN mg/dL 18 8 mg/dL 27   []  Creatinine mg/dL 3.01 H 6.01 mg/dL 0.93   []  eGFR mL/min/1.73 62 59 mL/min/1.73   []  BUN/Creatinine Ratio 14 10 24    []  Sodium mmol/L 141 134 mmol/L 144   []  Potassium mmol/L 4.7 3.5 mmol/L 5.2   []  Chloride mmol/L 102 96 mmol/L 106   []  Carbon Dioxide, Total  mmol/L 22 20 mmol/L 29   []  Calcium mg/dL 10 8.6 mg/dL 23.5   []  Protein, Total g/dL 7.6 6 g/dL 8.5   []  Albumin g/dL 4.8 3.8 g/dL 4.9   []  Globulin, Total g/dL 2.8 1.5 g/dL 4.5   []  Bilirubin, Total mg/dL 0.4 0 mg/dL 1.2   []  Alkaline Phosphatase IU/L 70 44 IU/L 121   []  AST (SGOT) IU/L 43 H 0 IU/L 40   []  ALT (SGPT) IU/L 70 H 0 IU/L 44    Clear Selections   Date Collected Date Completed Ordering Provider Account Name  12/16/2022 11:20 AM 12/17/2022 04:35 AM [facility] .   []  Test, Units Result Reference Interval Notes  []  Glucose mg/dL 573 H 70 mg/dL 99   []  BUN mg/dL 18 8 mg/dL 27   []  Creatinine mg/dL 2.20 H 2.54 mg/dL 2.70   []  eGFR mL/min/1.73 62 59 mL/min/1.73   []  BUN/Creatinine Ratio 14 10 24    []  Sodium mmol/L 141 134 mmol/L 144   []  Potassium mmol/L 4.7 3.5 mmol/L 5.2   []  Chloride mmol/L 102 96 mmol/L 106   []  Carbon Dioxide, Total mmol/L 22 20  mmol/L 29   []  Calcium mg/dL 10 8.6 mg/dL 09.8   []  Protein, Total g/dL 7.6 6 g/dL 8.5   []  Albumin g/dL 4.8 3.8 g/dL 4.9   []  Globulin, Total g/dL 2.8 1.5 g/dL 4.5   []  Bilirubin, Total mg/dL 0.4 0 mg/dL 1.2   []  Alkaline Phosphatase IU/L 70 44 IU/L 121   []  AST (SGOT) IU/L 43 H 0 IU/L 40   []  ALT (SGPT) IU/L 70 H 0 IU/L 44    Clear Selections

## 2022-12-18 NOTE — Addendum Note (Signed)
Addended by: Eleonore Chiquito on: 12/18/2022 05:38 PM   Modules accepted: Orders

## 2022-12-18 NOTE — Telephone Encounter (Signed)
Go to the [facility] tab and you can view the labs.

## 2022-12-19 ENCOUNTER — Telehealth (HOSPITAL_BASED_OUTPATIENT_CLINIC_OR_DEPARTMENT_OTHER): Payer: Self-pay

## 2022-12-25 ENCOUNTER — Ambulatory Visit (HOSPITAL_BASED_OUTPATIENT_CLINIC_OR_DEPARTMENT_OTHER)
Admission: RE | Admit: 2022-12-25 | Discharge: 2022-12-25 | Disposition: A | Discharge status: Home / Self Care | Payer: PPO | Source: Ambulatory Visit | Attending: Cardiology | Admitting: Cardiology

## 2022-12-25 DIAGNOSIS — E785 Hyperlipidemia, unspecified: Secondary | ICD-10-CM

## 2022-12-25 DIAGNOSIS — E781 Pure hyperglyceridemia: Secondary | ICD-10-CM

## 2022-12-26 ENCOUNTER — Ambulatory Visit: Payer: PPO | Attending: Cardiovascular Disease | Admitting: Pharmacist

## 2022-12-26 DIAGNOSIS — K760 Fatty (change of) liver, not elsewhere classified: Secondary | ICD-10-CM

## 2022-12-26 DIAGNOSIS — E785 Hyperlipidemia, unspecified: Secondary | ICD-10-CM

## 2022-12-26 NOTE — Patient Instructions (Addendum)
It was great to see you today!  We will submit a prior authorization to your insurance for Repatha (evolocumab). We will communicate the approval and estimated copay to you over the phone.   If approved: START Repatha 140 mg injected into the skin every 2 weeks (14 days)  Continue Lovaza 2 g (2 capsules) twice daily, fenofibrate 160 mg daily  Try to make a few of the lifestyle changes we talked about: decreasing saturated fat, increasing physical activity.   Return for a repeat cholesterol panel 2-3 months after starting the new medication

## 2022-12-26 NOTE — Progress Notes (Signed)
Patient ID: David Gill                 DOB: 1960/11/13                    MRN: [id number]      HPI: David Gill is a 62 y.o. male patient referred to lipid clinic by Dr. Tomie China. PMH is significant for HTN, T2DM, renal insufficiency, and HLD  Patient was last seen on 07/17/22 by Dr. Tomie China. Patient reported body aches to which he told the patient to stop taking rosuvastatin for 2 weeks then to see how the patient feels. He did not resume taking the rosuvastatin.   Patient presents to lipid clinic today as a referral from Dr. Tomie China. His most recent lipid panel was completed at [facility] on 12/16/22. Patients lipid panel is significant for TC 251, TG 254, HDL 42, LDL 161. He was not on rosuvastatin 10mg  at this time. Patients AST and ALT are also elevated at 43 and 70. Patient is currently on fenofibrate 160mg  daily and Lovaza 2g BID. Patient also had an appointment yesterday (11/20) for a calcium score scan, results not yet available. Patient stopped taking rosuvastatin 10mg  daily in 07/2022 due to body aches/myalgia. Patient reported taking simvastatin 10-15 years ago and it also caused muscle pain. 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 Discuss fatty deposits found on liver in 2021. Educate patient on how lifestyle modifications can affect fatty liver disease. Patient admits his diet is not the best and consumes carbs, saturated fat, and snacks often possibly because of boredom. Educate patient on alternatives such as swapping out whole milk for 2% or 1% and swapping out white pasta for brown/wheat pasta. Patient stated he walks around his house and drive way for 15 minutes 2 times per week. Patient has gait problems that affect how much he can exercise. Dicussed something like a stationary bike or desk bike. Reviewed options for lowering LDL cholesterol, including ezetimibe, PCSK-9 inhibitors, and bempedoic acid.  Discussed mechanisms of action, dosing, side effects and  potential decreases in LDL cholesterol.  Also reviewed cost information and potential options for patient assistance.    Current Medications: Lovaza 2g BID, fenofibrate 160mg  daily Intolerances: rosuvastatin 10mg  & simvastatin (myalgia) Risk Factors: DM LDL-C goal: <70 (<55 depending on CAC score) ApoB goal:   Diet: Admits his diet is not where it should be. He drinks mostly water, crystal light with water, and a coke zero sometimes. Both him and his wife grocery shop. If he goes out to eat he will sometimes order fish grilled or sometimes fried. Patient says he is likely snacking because he is bored vs hungry.  Breakfast: Cheerios with whole milk  Lunch: can soup or ham sandwich  Dinner: cubed steak with gravy, baked chicken with mac & cheese, potatoes or other carbohydrate-rich sides Snacks: ice cream, chips, lance crackers with peanut butter,    Exercise: walk around house and driveway for 15 minutes 2 times a week. Patient has gait issues which hinders him from walking outside more.   Family History: Mother: MI, CAD, HLD, liver disease, colon cancer; Father: HTN; Brother: DM; Sister: DM; Maternal grandmother: breast cancer; Maternal grandfather: stroke;Paternal grandmother: DM; Paternal grandfather: CAD  Social History: former smoker, former weekend binge drinker. No longer consumes any alcohol.   Labs: Lipid Panel: TC 251, TG 254, HDL 42, LDL 161   Past Medical History:  Diagnosis Date   Abnormal gait 03/21/2020  Adjustment disorder with emotional disturbance 08/13/2019   Formatting of this note might be different from the original. 2021   Allergic rhinitis 10/14/2019   Anxiety 10/23/2019   Atrophic kidney 10/24/2019   Formatting of this note might be different from the original. 2021:  CT right hydronephrosis   Back pain    Body mass index (BMI) 32.0-32.9, adult 02/10/2020   Diabetes mellitus due to underlying condition with unspecified complications (HCC) 07/18/2020    Diabetes mellitus without complication (HCC)    Dizziness 10/23/2019   Essential hypertension 10/23/2019   Exotropia, left eye 09/13/2015   History of tobacco abuse    Hypercalcemia 06/07/2020   Hyperglycemia    Hypertension    Hypertriglyceridemia 04/07/2020   Hypokalemia 10/23/2019   Insomnia    Left lumbar radiculitis 02/23/2019   Formatting of this note might be different from the original. 2021: MRI DDD with impingement   Low testosterone level in male    Macular scar of both eyes 09/13/2015   Metabolic acidosis with increased anion gap and accumulation of organic acids 10/23/2019   Osteoarthritis 10/23/2019   Renal insufficiency 06/14/2020   SIRS (systemic inflammatory response syndrome) (HCC) 10/23/2019   Tachycardia 10/23/2019    Current Outpatient Medications on File Prior to Visit  Medication Sig Dispense Refill   diltiazem (DILACOR XR) 240 MG 24 hr capsule Take 240 mg by mouth daily.     acetaminophen (TYLENOL) 500 MG tablet Take 500 mg by mouth every 6 (six) hours as needed for moderate pain.     cetirizine (ZYRTEC) 10 MG tablet Take 10 mg by mouth daily.     fenofibrate 160 MG tablet Take 160 mg by mouth daily.     omega-3 acid ethyl esters (LOVAZA) 1 g capsule Take 2 capsules by mouth 2 (two) times daily.     sertraline (ZOLOFT) 100 MG tablet Take 100 mg by mouth daily.     sitaGLIPtin (JANUVIA) 50 MG tablet Take 1 tablet (50 mg total) by mouth daily. 90 tablet 0   zolpidem (AMBIEN CR) 12.5 MG CR tablet Take 12.5 mg by mouth at bedtime as needed for sleep.     No current facility-administered medications on file prior to visit.    Allergies  Allergen Reactions   Sulfamethoxazole Hives   Triamcinolone Acetonide Hives    Assessment/Plan:  1. Hyperlipidemia -  Hyperlipidemia Assessment: Patient is currently on fenofibrate 160mg  daily and Lovaza 2g BID.  Patient has intolerance to statins due to myalgia (atorvastatin & simvastatin)  Patient admits diet is  not where it should be. Educate patient on alternative healthy options. Patient exercises by walking his driveway and around the house for 15 minutes 2 times a week. Encourage to use stationary bike d/t gait issues and increase amount of exercise to moderate intensity for 173min/week Educate patient on how lifestyle modifications can improve fatty liver disease.  Reviewed Repatha with patient including dosing, side effects and potential decreases in LDL cholesterol.  Also reviewed cost information and potential options for patient assistance. Inform patient that cost might be around $90/3 months or ~$45-47/1 month  Plan: Send PA for Repatha Labs in 2-3 months following Repatha start date   Thank you,  Minette Brine, Student Pharmacist  Olene Floss, Pharm.D, BCACP, BCPS, CPP [facility] A Division of [facility] [address], [place], [place] [address]  Phone: [phone]; Fax: [phone]

## 2022-12-26 NOTE — Assessment & Plan Note (Addendum)
Assessment: Patient is currently on fenofibrate 160mg  daily and Lovaza 2g BID.  Patient has intolerance to statins due to myalgia (atorvastatin & simvastatin)  Patient admits diet is not where it should be. Educate patient on alternative healthy options. Patient exercises by walking his driveway and around the house for 15 minutes 2 times a week. Encourage to use stationary bike d/t gait issues and increase amount of exercise to moderate intensity for 169min/week Educate patient on how lifestyle modifications can improve fatty liver disease.  Reviewed Repatha with patient including dosing, side effects and potential decreases in LDL cholesterol.  Also reviewed cost information and potential options for patient assistance. Inform patient that cost might be around $90/3 months or ~$45-47/1 month  Plan: Send PA for Repatha Labs in 2-3 months following Repatha start date

## 2022-12-27 ENCOUNTER — Other Ambulatory Visit (HOSPITAL_COMMUNITY): Payer: Self-pay

## 2022-12-27 ENCOUNTER — Telehealth: Payer: Self-pay | Admitting: Pharmacy Technician

## 2022-12-27 DIAGNOSIS — E785 Hyperlipidemia, unspecified: Secondary | ICD-10-CM

## 2022-12-27 NOTE — Telephone Encounter (Signed)
Pharmacy Patient Advocate Encounter  Received notification from [facility] [employer]VANT[employer]/RX [employer]VANCE that Prior Authorization for repatha has been APPROVED from 12/27/22 to 06/26/23. Ran test claim, Copay is $134.65- one month. This test claim was processed through [facility]- copay amounts may vary at other pharmacies due to pharmacy/plan contracts, or as the patient moves through the different stages of their insurance plan.   PA #/Case ID/Reference #: [id number]

## 2022-12-27 NOTE — Telephone Encounter (Signed)
-----   Message from Olene Floss sent at 12/26/2022  4:28 PM EST ----- Please do PA for repatha E78.5

## 2022-12-27 NOTE — Telephone Encounter (Signed)
Pharmacy Patient Advocate Encounter   Received notification from  [facility] charts  that prior authorization for repatha is required/requested.   Insurance verification completed.   The patient is insured through [facility] ADVANTAGE/RX ADVANCE .   Per test claim: PA required; PA submitted to above mentioned insurance via CoverMyMeds Key/confirmation #/EOC [facility] Status is pending

## 2022-12-30 ENCOUNTER — Telehealth: Payer: Self-pay

## 2022-12-30 MED ORDER — REPATHA SURECLICK 140 MG/ML ~~LOC~~ SOAJ: 1.0000 mL | SUBCUTANEOUS | 11 refills | Status: DC

## 2022-12-30 NOTE — Telephone Encounter (Signed)
Patient called back. Is ok with the cost. Rx sent to pharmacy. He will get labs in 2-3 months. Labs entered.

## 2022-12-30 NOTE — Addendum Note (Signed)
Addended by: Malena Peer D on: 12/30/2022 12:52 PM   Modules accepted: Orders

## 2022-12-30 NOTE — Telephone Encounter (Signed)
Left VM for patient to inform him the PA for Repatha was approved and to call back to discuss the cost of the medication.

## 2023-01-24 DIAGNOSIS — J0100 Acute maxillary sinusitis, unspecified: Secondary | ICD-10-CM | POA: Diagnosis not present

## 2023-03-04 ENCOUNTER — Other Ambulatory Visit (HOSPITAL_COMMUNITY): Payer: Self-pay | Admitting: Family Medicine

## 2023-03-04 ENCOUNTER — Ambulatory Visit (HOSPITAL_COMMUNITY)
Admission: RE | Admit: 2023-03-04 | Discharge: 2023-03-04 | Disposition: A | Discharge status: Home / Self Care | Payer: PPO | Source: Ambulatory Visit | Attending: Family Medicine | Admitting: Family Medicine

## 2023-03-04 DIAGNOSIS — R059 Cough, unspecified: Secondary | ICD-10-CM | POA: Diagnosis not present

## 2023-03-04 DIAGNOSIS — J069 Acute upper respiratory infection, unspecified: Secondary | ICD-10-CM

## 2023-03-04 DIAGNOSIS — E6609 Other obesity due to excess calories: Secondary | ICD-10-CM | POA: Diagnosis not present

## 2023-03-04 DIAGNOSIS — Z6830 Body mass index (BMI) 30.0-30.9, adult: Secondary | ICD-10-CM | POA: Diagnosis not present

## 2023-03-04 DIAGNOSIS — Z20828 Contact with and (suspected) exposure to other viral communicable diseases: Secondary | ICD-10-CM | POA: Diagnosis not present

## 2023-03-04 DIAGNOSIS — R6889 Other general symptoms and signs: Secondary | ICD-10-CM | POA: Diagnosis not present

## 2023-03-11 DIAGNOSIS — E088 Diabetes mellitus due to underlying condition with unspecified complications: Secondary | ICD-10-CM | POA: Diagnosis not present

## 2023-03-11 DIAGNOSIS — E785 Hyperlipidemia, unspecified: Secondary | ICD-10-CM | POA: Diagnosis not present

## 2023-03-11 NOTE — Addendum Note (Signed)
Addended by: Eleonore Chiquito on: 03/11/2023 02:18 PM   Modules accepted: Orders

## 2023-03-12 LAB — LIPID PANEL
Chol/HDL Ratio: 2.7 {ratio} (ref 0.0–5.0)
Cholesterol, Total: 114 mg/dL (ref 100–199)
HDL: 42 mg/dL (ref >39)
LDL Chol Calc (NIH): 29 mg/dL (ref 0–99)
Triglycerides: 294 mg/dL — ABNORMAL HIGH (ref 0–149)
VLDL Cholesterol Cal: 43 mg/dL — ABNORMAL HIGH (ref 5–40)

## 2023-03-12 LAB — HEMOGLOBIN A1C
Est. average glucose Bld gHb Est-mCnc: 151 mg/dL
Hgb A1c MFr Bld: 6.9 % — ABNORMAL HIGH (ref 4.8–5.6)

## 2023-03-13 ENCOUNTER — Encounter: Payer: Self-pay | Admitting: Pharmacist

## 2023-05-29 DIAGNOSIS — F419 Anxiety disorder, unspecified: Secondary | ICD-10-CM | POA: Diagnosis not present

## 2023-05-29 DIAGNOSIS — N183 Chronic kidney disease, stage 3 unspecified: Secondary | ICD-10-CM | POA: Diagnosis not present

## 2023-05-29 DIAGNOSIS — D72829 Elevated white blood cell count, unspecified: Secondary | ICD-10-CM | POA: Diagnosis not present

## 2023-05-29 DIAGNOSIS — E782 Mixed hyperlipidemia: Secondary | ICD-10-CM | POA: Diagnosis not present

## 2023-05-29 DIAGNOSIS — I10 Essential (primary) hypertension: Secondary | ICD-10-CM | POA: Diagnosis not present

## 2023-05-29 DIAGNOSIS — E538 Deficiency of other specified B group vitamins: Secondary | ICD-10-CM | POA: Diagnosis not present

## 2023-05-29 DIAGNOSIS — E1165 Type 2 diabetes mellitus with hyperglycemia: Secondary | ICD-10-CM | POA: Diagnosis not present

## 2023-05-29 DIAGNOSIS — R6889 Other general symptoms and signs: Secondary | ICD-10-CM | POA: Diagnosis not present

## 2023-05-29 DIAGNOSIS — R7989 Other specified abnormal findings of blood chemistry: Secondary | ICD-10-CM | POA: Diagnosis not present

## 2023-05-29 DIAGNOSIS — Z6830 Body mass index (BMI) 30.0-30.9, adult: Secondary | ICD-10-CM | POA: Diagnosis not present

## 2023-05-29 DIAGNOSIS — E6609 Other obesity due to excess calories: Secondary | ICD-10-CM | POA: Diagnosis not present

## 2023-06-16 DIAGNOSIS — G4730 Sleep apnea, unspecified: Secondary | ICD-10-CM | POA: Diagnosis not present

## 2023-07-07 DIAGNOSIS — Z0001 Encounter for general adult medical examination with abnormal findings: Secondary | ICD-10-CM | POA: Diagnosis not present

## 2023-07-07 DIAGNOSIS — Z1331 Encounter for screening for depression: Secondary | ICD-10-CM | POA: Diagnosis not present

## 2023-07-07 DIAGNOSIS — N183 Chronic kidney disease, stage 3 unspecified: Secondary | ICD-10-CM | POA: Diagnosis not present

## 2023-07-07 DIAGNOSIS — E663 Overweight: Secondary | ICD-10-CM | POA: Diagnosis not present

## 2023-07-07 DIAGNOSIS — Z20828 Contact with and (suspected) exposure to other viral communicable diseases: Secondary | ICD-10-CM | POA: Diagnosis not present

## 2023-07-07 DIAGNOSIS — J069 Acute upper respiratory infection, unspecified: Secondary | ICD-10-CM | POA: Diagnosis not present

## 2023-07-07 DIAGNOSIS — F419 Anxiety disorder, unspecified: Secondary | ICD-10-CM | POA: Diagnosis not present

## 2023-07-07 DIAGNOSIS — R6889 Other general symptoms and signs: Secondary | ICD-10-CM | POA: Diagnosis not present

## 2023-07-07 DIAGNOSIS — E1165 Type 2 diabetes mellitus with hyperglycemia: Secondary | ICD-10-CM | POA: Diagnosis not present

## 2023-07-07 DIAGNOSIS — Z6829 Body mass index (BMI) 29.0-29.9, adult: Secondary | ICD-10-CM | POA: Diagnosis not present

## 2023-07-07 DIAGNOSIS — E538 Deficiency of other specified B group vitamins: Secondary | ICD-10-CM | POA: Diagnosis not present

## 2023-07-07 DIAGNOSIS — E782 Mixed hyperlipidemia: Secondary | ICD-10-CM | POA: Diagnosis not present

## 2023-07-22 DIAGNOSIS — G4733 Obstructive sleep apnea (adult) (pediatric): Secondary | ICD-10-CM | POA: Diagnosis not present

## 2023-07-28 ENCOUNTER — Telehealth: Payer: Self-pay | Admitting: Pharmacy Technician

## 2023-07-28 NOTE — Telephone Encounter (Addendum)
 Pharmacy Patient Advocate Encounter   Received notification from CoverMyMeds: that prior authorization for repatha  is required/requested.   Insurance verification completed.   The patient is insured through [facility] ADVANTAGE/RX ADVANCE .   Per test claim: PA required; PA submitted to above mentioned insurance via CoverMyMeds Key/confirmation #/EOC [id number] Status is pending

## 2023-07-29 NOTE — Telephone Encounter (Signed)
 Pharmacy Patient Advocate Encounter  Received notification from [facility] ADVANT[employer]/RX ADVANCE that Prior Authorization for repatha  140mg  has been APPROVED from 07/28/23 to 07/27/24. [facility] is closed right now, sent patient a message   PA #/Case ID/Reference #: [id number]

## 2023-07-30 ENCOUNTER — Ambulatory Visit: Admitting: Cardiology

## 2023-07-30 DIAGNOSIS — M25511 Pain in right shoulder: Secondary | ICD-10-CM | POA: Diagnosis not present

## 2023-07-30 DIAGNOSIS — W19XXXA Unspecified fall, initial encounter: Secondary | ICD-10-CM | POA: Diagnosis not present

## 2023-08-06 DIAGNOSIS — M25511 Pain in right shoulder: Secondary | ICD-10-CM | POA: Diagnosis not present

## 2023-08-12 DIAGNOSIS — H31003 Unspecified chorioretinal scars, bilateral: Secondary | ICD-10-CM | POA: Diagnosis not present

## 2023-08-12 DIAGNOSIS — H5203 Hypermetropia, bilateral: Secondary | ICD-10-CM | POA: Diagnosis not present

## 2023-08-12 DIAGNOSIS — E119 Type 2 diabetes mellitus without complications: Secondary | ICD-10-CM | POA: Diagnosis not present

## 2023-08-12 DIAGNOSIS — H5010 Unspecified exotropia: Secondary | ICD-10-CM | POA: Diagnosis not present

## 2023-08-13 DIAGNOSIS — G4733 Obstructive sleep apnea (adult) (pediatric): Secondary | ICD-10-CM | POA: Diagnosis not present

## 2023-08-20 DIAGNOSIS — M25511 Pain in right shoulder: Secondary | ICD-10-CM | POA: Diagnosis not present

## 2023-08-21 DIAGNOSIS — G4733 Obstructive sleep apnea (adult) (pediatric): Secondary | ICD-10-CM | POA: Diagnosis not present

## 2023-08-25 DIAGNOSIS — G4730 Sleep apnea, unspecified: Secondary | ICD-10-CM | POA: Diagnosis not present

## 2023-08-25 DIAGNOSIS — G4733 Obstructive sleep apnea (adult) (pediatric): Secondary | ICD-10-CM | POA: Diagnosis not present

## 2023-08-25 DIAGNOSIS — E663 Overweight: Secondary | ICD-10-CM | POA: Diagnosis not present

## 2023-08-25 DIAGNOSIS — M25511 Pain in right shoulder: Secondary | ICD-10-CM | POA: Diagnosis not present

## 2023-08-25 DIAGNOSIS — Z6829 Body mass index (BMI) 29.0-29.9, adult: Secondary | ICD-10-CM | POA: Diagnosis not present

## 2023-08-25 DIAGNOSIS — E538 Deficiency of other specified B group vitamins: Secondary | ICD-10-CM | POA: Diagnosis not present

## 2023-08-27 ENCOUNTER — Other Ambulatory Visit: Payer: Self-pay

## 2023-08-28 ENCOUNTER — Ambulatory Visit: Attending: Cardiology | Admitting: Cardiology

## 2023-08-28 ENCOUNTER — Encounter: Payer: Self-pay | Admitting: Cardiology

## 2023-08-28 VITALS — BP 142/70 | HR 80 | Ht 65.6 in | Wt 183.6 lb

## 2023-08-28 DIAGNOSIS — E781 Pure hyperglyceridemia: Secondary | ICD-10-CM | POA: Diagnosis not present

## 2023-08-28 DIAGNOSIS — E088 Diabetes mellitus due to underlying condition with unspecified complications: Secondary | ICD-10-CM | POA: Diagnosis not present

## 2023-08-28 DIAGNOSIS — I10 Essential (primary) hypertension: Secondary | ICD-10-CM | POA: Diagnosis not present

## 2023-08-28 DIAGNOSIS — N289 Disorder of kidney and ureter, unspecified: Secondary | ICD-10-CM

## 2023-08-28 DIAGNOSIS — R931 Abnormal findings on diagnostic imaging of heart and coronary circulation: Secondary | ICD-10-CM | POA: Insufficient documentation

## 2023-08-28 MED ORDER — ASPIRIN 81 MG PO TBEC: 81.0000 mg | DELAYED_RELEASE_TABLET | Freq: Every day | ORAL | 3 refills | Status: AC

## 2023-08-28 NOTE — Progress Notes (Signed)
 Cardiology Office Note:    Date:  08/28/2023   ID:  David Gill, DOB [birth year], MRN [id number]  PCP:  Marvine Rush, MD  Cardiologist:  Jennifer JONELLE Crape, MD   Referring MD: Marvine Rush, MD    ASSESSMENT:    1. Hypertriglyceridemia   2. Essential hypertension   3. Diabetes mellitus due to underlying condition with unspecified complications (HCC)   4. Elevated coronary artery calcium  score   5. Renal insufficiency    PLAN:    In order of problems listed above:  Elevated calcium  score: Secondary prevention stressed with the patient.  Importance of compliance with diet medication stressed and vocalized understanding.  He was advised to use a stationary bicycle or walk for half an hour on a daily basis and he promises to do so. Essential hypertension: Blood pressure is stable and diet was emphasized. Mixed dyslipidemia: On lipid-lowering medications followed by primary care.  LDL is at goal.  I reviewed KPN sheet and discussed findings with the patient. Renal insufficiency: Stable and followed by primary care. Diabetes mellitus: With diabetic nephropathy: Diet emphasized.  His hemoglobin A1c is getting better and he is happy about it. Patient will be seen in follow-up appointment in 6 months or earlier if the patient has any concerns.    Medication Adjustments/Labs and Tests Ordered: Current medicines are reviewed at length with the patient today.  Concerns regarding medicines are outlined above.  Orders Placed This Encounter  Procedures   EKG 12-Lead   Meds ordered this encounter  Medications   aspirin  EC 81 MG tablet    Sig: Take 1 tablet (81 mg total) by mouth daily. Swallow whole.    Dispense:  90 tablet    Refill:  3     No chief complaint on file.    History of Present Illness:    David Gill is a 63 y.o. male.  Patient has past medical history of elevated calcium  score, essential hypertension, mixed dyslipidemia and diabetes mellitus.  He  denies any problems at this time and takes care of activities of daily living.  No chest pain orthopnea or PND.  At the time of my evaluation, the patient is alert awake oriented and in no distress.  Past Medical History:  Diagnosis Date   Abnormal gait 03/21/2020   Adjustment disorder with emotional disturbance 08/13/2019   Formatting of this note might be different from the original. 2021   Allergic rhinitis 10/14/2019   Anxiety 10/23/2019   Atrophic kidney 10/24/2019   Formatting of this note might be different from the original. 2021:  CT right hydronephrosis   Back pain    Body mass index (BMI) 32.0-32.9, adult 02/10/2020   Diabetes mellitus due to underlying condition with unspecified complications (HCC) 07/18/2020   Diabetes mellitus without complication (HCC)    Dizziness 10/23/2019   Essential hypertension 10/23/2019   Exotropia, left eye 09/13/2015   Hepatic steatosis 10/24/2019   Formatting of this note might be different from the original. 2021: incidental on CT     History of tobacco abuse    Hypercalcemia 06/07/2020   Hyperglycemia    Hyperlipidemia 04/07/2020   Current Medications: Lovaza 2g BID, fenofibrate 160mg  daily, Repatha  140mg  subcutaneous q14d  Intolerances: rosuvastatin  10mg  & simvastatin (myalgia)  Risk Factors: DM  LDL-C goal: <70     Hypertension    Hypertriglyceridemia 04/07/2020   Hypokalemia 10/23/2019   Insomnia    Left lumbar radiculitis 02/23/2019   Formatting of this note might  be different from the original. 2021: MRI DDD with impingement   Low testosterone level in male    Macular scar of both eyes 09/13/2015   Metabolic acidosis with increased anion gap and accumulation of organic acids 10/23/2019   Osteoarthritis 10/23/2019   Renal insufficiency 06/14/2020   SIRS (systemic inflammatory response syndrome) (HCC) 10/23/2019   Tachycardia 10/23/2019    Past Surgical History:  Procedure Laterality Date   TONSILLECTOMY      Current  Medications: Current Meds  Medication Sig   acetaminophen  (TYLENOL ) 500 MG tablet Take 500 mg by mouth every 6 (six) hours as needed for moderate pain.   aspirin  EC 81 MG tablet Take 1 tablet (81 mg total) by mouth daily. Swallow whole.   diltiazem  (DILACOR XR ) 240 MG 24 hr capsule Take 240 mg by mouth daily.   Evolocumab  (REPATHA  SURECLICK) 140 MG/ML SOAJ Inject 140 mg into the skin every 14 (fourteen) days.   fenofibrate 160 MG tablet Take 160 mg by mouth daily.   omega-3 acid ethyl esters (LOVAZA) 1 g capsule Take 2 capsules by mouth 2 (two) times daily.   sertraline (ZOLOFT) 100 MG tablet Take 100 mg by mouth daily.   sitaGLIPtin  (JANUVIA ) 50 MG tablet Take 1 tablet (50 mg total) by mouth daily.   zolpidem  (AMBIEN  CR) 12.5 MG CR tablet Take 12.5 mg by mouth at bedtime as needed for sleep.     Allergies:   Sulfamethoxazole and Triamcinolone acetonide   Social History   Socioeconomic History   Marital status: Married    Spouse name: Not on file   Number of children: Not on file   Years of education: Not on file   Highest education level: Not on file  Occupational History   Not on file  Tobacco Use   Smoking status: Former    Types: Cigarettes   Smokeless tobacco: Never  Substance and Sexual Activity   Alcohol use: Yes    Comment: seldom   Drug use: Never   Sexual activity: Yes  Other Topics Concern   Not on file  Social History Narrative   Left handed   Social Drivers of Health   Financial Resource Strain: Not on file  Food Insecurity: Not on file  Transportation Needs: Not on file  Physical Activity: Not on file  Stress: Not on file  Social Connections: Unknown (06/19/2021)   Received from [facility]   Social Network    Social Network: Not on file     Family History: The patient's family history includes Breast cancer in his maternal grandmother; CAD in his mother and paternal grandfather; Colon cancer in his mother; Diabetes in his brother, paternal  grandmother, and sister; Heart attack in his mother; Hypertension in his father; Stroke in his maternal grandfather.  ROS:   Please see the history of present illness.    All other systems reviewed and are negative.  EKGs/Labs/Other Studies Reviewed:    The following studies were reviewed today: .SABRAEKG Interpretation Date/Time:  Thursday August 28 2023 13:54:26 EDT Ventricular Rate:  80 PR Interval:  126 QRS Duration:  90 QT Interval:  386 QTC Calculation: 445 R Axis:   79  Text Interpretation: Normal sinus rhythm Normal ECG When compared with ECG of 22-Oct-2019 23:54, Vent. rate has decreased BY  62 BPM Confirmed by Izayiah Tibbitts, Jennifer [phone]) on 08/28/2023 2:06:45 PM     Recent Labs: No results found for requested labs within last 365 days.  Recent Lipid Panel  Component Value Date/Time   CHOL 114 03/11/2023 1422   TRIG 294 (H) 03/11/2023 1422   HDL 42 03/11/2023 1422   CHOLHDL 2.7 03/11/2023 1422   LDLCALC 29 03/11/2023 1422    Physical Exam:    VS:  BP (!) 142/70   Pulse 80   Ht 5' 5.6 (1.666 m)   Wt 183 lb 9.6 oz (83.3 kg)   SpO2 93%   BMI 30.00 kg/m     Wt Readings from Last 3 Encounters:  08/28/23 183 lb 9.6 oz (83.3 kg)  07/17/22 183 lb 9.6 oz (83.3 kg)  07/19/21 182 lb 9.6 oz (82.8 kg)     GEN: Patient is in no acute distress HEENT: Normal NECK: No JVD; No carotid bruits LYMPHATICS: No lymphadenopathy CARDIAC: Hear sounds regular, 2/6 systolic murmur at the apex. RESPIRATORY:  Clear to auscultation without rales, wheezing or rhonchi  ABDOMEN: Soft, non-tender, non-distended MUSCULOSKELETAL:  No edema; No deformity  SKIN: Warm and dry NEUROLOGIC:  Alert and oriented x 3 PSYCHIATRIC:  Normal affect   Signed, Jennifer JONELLE Crape, MD  08/28/2023 2:22 PM    [facility]

## 2023-08-28 NOTE — Patient Instructions (Signed)
 Medication Instructions:  Your physician has recommended you make the following change in your medication:   START: Aspirin  81 mg daily  *If you need a refill on your cardiac medications before your next appointment, please call your pharmacy*  Lab Work: None If you have labs (blood work) drawn today and your tests are completely normal, you will receive your results only by: MyChart Message (if you have MyChart) OR A paper copy in the mail If you have any lab test that is abnormal or we need to change your treatment, we will call you to review the results.  Testing/Procedures: None  Follow-Up: At [facility], you and your health needs are our priority.  As part of our continuing mission to provide you with exceptional heart care, our providers are all part of one team.  This team includes your primary Cardiologist (physician) and Advanced Practice Providers or APPs (Physician Assistants and Nurse Practitioners) who all work together to provide you with the care you need, when you need it.  Your next appointment:   1 year(s)  Provider:   Jennifer Crape, MD    We recommend signing up for the patient portal called MyChart.  Sign up information is provided on this After Visit Summary.  MyChart is used to connect with patients for Virtual Visits (Telemedicine).  Patients are able to view lab/test results, encounter notes, upcoming appointments, etc.  Non-urgent messages can be sent to your provider as well.   To learn more about what you can do with MyChart, go to [email or web address].   Other Instructions None

## 2023-09-08 DIAGNOSIS — M25511 Pain in right shoulder: Secondary | ICD-10-CM | POA: Diagnosis not present

## 2023-09-11 DIAGNOSIS — M25511 Pain in right shoulder: Secondary | ICD-10-CM | POA: Diagnosis not present

## 2023-09-21 DIAGNOSIS — G4733 Obstructive sleep apnea (adult) (pediatric): Secondary | ICD-10-CM | POA: Diagnosis not present

## 2023-09-24 DIAGNOSIS — M25511 Pain in right shoulder: Secondary | ICD-10-CM | POA: Diagnosis not present

## 2023-09-25 ENCOUNTER — Telehealth: Payer: Self-pay

## 2023-09-25 NOTE — Telephone Encounter (Signed)
   Pre-operative Risk Assessment    Patient Name: David Gill  DOB: 25-Feb-1960 MRN: [id number]   Date of last office visit: 08/28/23 Date of next office visit: N/A   Request for Surgical Clearance    Procedure:  right reverse total shoulder arthroplasty  Date of Surgery:  Clearance TBD                                Surgeon:  Dr. Elspeth Her Surgeon's Group or Practice Name:  [facility] Phone number:  [phone] Fax number:  [phone]   Type of Clearance Requested:   - Medical    Type of Anesthesia:  choice   Additional requests/questions:    SignedAnnabella LITTIE Sayres   09/25/2023, 3:40 PM

## 2023-09-26 NOTE — Telephone Encounter (Signed)
 Dr. Edwyna ,  You saw this patient on 08/28/2023. Per protocol we request that you comment on his cardiac risk to proceed with  right reverse total shoulder arthroplasty on TBD  since it has been less than 2 months since evaluated in the office. Please send your comment to P CV Pre-Op Pool.  Thank you, Lamarr Satterfield DNP, ANP, AACC.

## 2023-10-01 NOTE — Telephone Encounter (Signed)
   Name: David Gill  DOB: October 07, 1960  MRN: [id number]  Primary Cardiologist: None   Preoperative team, please contact this patient and set up a phone call appointment for further preoperative risk assessment. Please obtain consent and complete medication review. Thank you for your help.  I confirm that guidance regarding antiplatelet and oral anticoagulation therapy has been completed and, if necessary, noted below.  Per Dr. Edwyna - Please check with the patient effort tolerance and what you think in terms of how many METS of exercise patient can tolerate without any symptoms and we can make a decision based on this. If not patient will need to be seen in appointment   I also confirmed the patient resides in the state of [place] . As per [facility] Medical Board telemedicine laws, the patient must reside in the state in which the provider is licensed.   Jon Nat Hails, PA 10/01/2023, 12:47 PM [facility]

## 2023-10-01 NOTE — Telephone Encounter (Signed)
 1st attempt to reach pt regarding surgical clearance and the need for an TELE appointment.  Left pt a detailed message to call back and get that scheduled.

## 2023-10-07 NOTE — Telephone Encounter (Signed)
 Pt is scheduled for TELE Preop appt 10/09/23

## 2023-10-09 ENCOUNTER — Ambulatory Visit: Attending: Cardiology | Admitting: Student

## 2023-10-09 DIAGNOSIS — Z01810 Encounter for preprocedural cardiovascular examination: Secondary | ICD-10-CM

## 2023-10-09 NOTE — Progress Notes (Signed)
 Virtual Visit via Telephone Note   Because of David Gill's co-morbid illnesses, he is at least at moderate risk for complications without adequate follow up.  This format is felt to be most appropriate for this patient at this time.  The patient did not have access to video technology/had technical difficulties with video requiring transitioning to audio format only (telephone).  All issues noted in this document were discussed and addressed.  No physical exam could be performed with this format.  Please refer to the patient's chart for his consent to telehealth for [facility].  Evaluation Performed:  Preoperative cardiovascular risk assessment _____________   Date:  10/09/2023   Patient ID:  David Gill, DOB 1960-08-30, MRN [id number] Patient Location:  Home Provider location:   Office  Primary Care Provider:  Marvine Rush, Gill Primary Cardiologist:  David JONELLE Crape, Gill  Chief Complaint / Patient Profile   63 y.o. y/o male with a h/o elevated coronary artery calcium  score, hypertension, hypertriglyceridemia/hyperlipidemia, T2DM who is pending right reverse total shoulder arthroplasty by David Gill and presents today for telephonic preoperative cardiovascular risk assessment.  History of Present Illness    David Gill is a 63 y.o. male who presents via audio/video conferencing for a telehealth visit today.  Pt was last seen in cardiology clinic on David Gill by 08/28/2023.  At that time David Gill was stable from a cardiac standpoint.  The patient is now pending procedure as outlined above. Since his last visit, he is doing well. Patient denies shortness of breath, dyspnea on exertion, lower extremity edema, orthopnea or PND. No chest pain, pressure, or tightness. No palpitations.  He is not experiencing any lightheadedness, dizziness, presyncope or syncope. His activity has been limited by shoulder pain. He walks outside his home about  15 minutes day. He also helps care for his father in law. He is able to perform light housework.   Past Medical History    Past Medical History:  Diagnosis Date   Abnormal gait 03/21/2020   Adjustment disorder with emotional disturbance 08/13/2019   Formatting of this note might be different from the original. 2021   Allergic rhinitis 10/14/2019   Anxiety 10/23/2019   Atrophic kidney 10/24/2019   Formatting of this note might be different from the original. 2021:  CT right hydronephrosis   Back pain    Body mass index (BMI) 32.0-32.9, adult 02/10/2020   Diabetes mellitus due to underlying condition with unspecified complications (HCC) 07/18/2020   Diabetes mellitus without complication (HCC)    Dizziness 10/23/2019   Essential hypertension 10/23/2019   Exotropia, left eye 09/13/2015   Hepatic steatosis 10/24/2019   Formatting of this note might be different from the original. 2021: incidental on CT     History of tobacco abuse    Hypercalcemia 06/07/2020   Hyperglycemia    Hyperlipidemia 04/07/2020   Current Medications: Lovaza 2g BID, fenofibrate 160mg  daily, Repatha  140mg  subcutaneous q14d  Intolerances: rosuvastatin  10mg  & simvastatin (myalgia)  Risk Factors: DM  LDL-C goal: <70     Hypertension    Hypertriglyceridemia 04/07/2020   Hypokalemia 10/23/2019   Insomnia    Left lumbar radiculitis 02/23/2019   Formatting of this note might be different from the original. 2021: MRI DDD with impingement   Low testosterone level in male    Macular scar of both eyes 09/13/2015   Metabolic acidosis with increased anion gap and accumulation of organic acids 10/23/2019   Osteoarthritis 10/23/2019  Renal insufficiency 06/14/2020   SIRS (systemic inflammatory response syndrome) (HCC) 10/23/2019   Tachycardia 10/23/2019   Past Surgical History:  Procedure Laterality Date   TONSILLECTOMY      Allergies  Allergies  Allergen Reactions   Sulfamethoxazole Hives   Triamcinolone  Acetonide Hives    Home Medications    Prior to Admission medications   Medication Sig Start Date End Date Taking? Authorizing Provider  acetaminophen  (TYLENOL ) 500 MG tablet Take 500 mg by mouth every 6 (six) hours as needed for moderate pain.    Provider, Historical, Gill  aspirin  EC 81 MG tablet Take 1 tablet (81 mg total) by mouth daily. Swallow whole. 08/28/23   Gill, David SAUNDERS, Gill  diltiazem  (DILACOR XR ) 240 MG 24 hr capsule Take 240 mg by mouth daily. 10/14/22   Provider, Historical, Gill  Evolocumab  (REPATHA  SURECLICK) 140 MG/ML SOAJ Inject 140 mg into the skin every 14 (fourteen) days. 12/30/22   Gill, Rajan R, Gill  fenofibrate 160 MG tablet Take 160 mg by mouth daily. 03/07/20   Provider, Historical, Gill  omega-3 acid ethyl esters (LOVAZA) 1 g capsule Take 2 capsules by mouth 2 (two) times daily. 03/07/20   Provider, Historical, Gill  sertraline (ZOLOFT) 100 MG tablet Take 100 mg by mouth daily. 02/01/22   Provider, Historical, Gill  sitaGLIPtin  (JANUVIA ) 50 MG tablet Take 1 tablet (50 mg total) by mouth daily. 08/28/21   [place], Gill  zolpidem  (AMBIEN  CR) 12.5 MG CR tablet Take 12.5 mg by mouth at bedtime as needed for sleep.    Provider, Historical, Gill    Physical Exam    Vital Signs:  David Gill does not have vital signs available for review today.  Given telephonic nature of communication, physical exam is limited. AAOx3. NAD. Normal affect.  Speech and respirations are unlabored.   Assessment & Plan    Primary Cardiologist: David SAUNDERS Crape, Gill  Preoperative cardiovascular risk assessment.  Right reverse total shoulder arthroplasty by David Gill  Chart reviewed as part of pre-operative protocol coverage. According to the RCRI, patient has a 0.4% risk of MACE. Patient reports activity equivalent to 4.0 METS (walks outside for 15 minutes a day, helps care for father in law, performs light yard work).   Given past medical history and time since last visit, based on  ACC/AHA guidelines, David Gill would be at acceptable risk for the planned procedure without further cardiovascular testing.   Patient was advised that if he develops new symptoms prior to surgery to contact our office to arrange a follow-up appointment.  he verbalized understanding.  I will route this recommendation to the requesting party via Epic fax function.  Please call with questions.  Time:   Today, I have spent 5 minutes with the patient with telehealth technology discussing medical history, symptoms, and management plan.     Barnie Hila, NP  10/09/2023, 8:12 AM

## 2023-11-05 DIAGNOSIS — G4733 Obstructive sleep apnea (adult) (pediatric): Secondary | ICD-10-CM | POA: Diagnosis not present

## 2023-11-05 DIAGNOSIS — G4700 Insomnia, unspecified: Secondary | ICD-10-CM | POA: Diagnosis not present

## 2023-11-05 DIAGNOSIS — Z01818 Encounter for other preprocedural examination: Secondary | ICD-10-CM | POA: Diagnosis not present

## 2023-11-05 DIAGNOSIS — I10 Essential (primary) hypertension: Secondary | ICD-10-CM | POA: Diagnosis not present

## 2023-11-05 DIAGNOSIS — R42 Dizziness and giddiness: Secondary | ICD-10-CM | POA: Diagnosis not present

## 2023-11-05 DIAGNOSIS — E785 Hyperlipidemia, unspecified: Secondary | ICD-10-CM | POA: Diagnosis not present

## 2023-11-05 DIAGNOSIS — Z23 Encounter for immunization: Secondary | ICD-10-CM | POA: Diagnosis not present

## 2023-11-05 DIAGNOSIS — N183 Chronic kidney disease, stage 3 unspecified: Secondary | ICD-10-CM | POA: Diagnosis not present

## 2023-11-06 ENCOUNTER — Other Ambulatory Visit: Payer: Self-pay | Admitting: Cardiology

## 2023-11-06 DIAGNOSIS — R931 Abnormal findings on diagnostic imaging of heart and coronary circulation: Secondary | ICD-10-CM

## 2023-11-06 DIAGNOSIS — E781 Pure hyperglyceridemia: Secondary | ICD-10-CM

## 2023-11-06 DIAGNOSIS — E785 Hyperlipidemia, unspecified: Secondary | ICD-10-CM

## 2023-12-22 NOTE — Progress Notes (Signed)
 Sent message, via epic in basket, requesting orders in epic from Careers adviser.

## 2023-12-23 NOTE — H&P (Signed)
 Patient's anticipated LOS is less than 2 midnights, meeting these requirements: - Younger than 93 - Lives within 1 hour of care - Has a competent adult at home to recover with post-op recover - NO history of  - Chronic pain requiring opiods  - Diabetes  - Coronary Artery Disease  - Heart failure  - Heart attack  - Stroke  - DVT/VTE  - Cardiac arrhythmia  - Respiratory Failure/COPD  - Renal failure  - Anemia  - Advanced Liver disease     David Gill is an 63 y.o. male.    Chief Complaint: right shoulder pain  HPI: Pt is a 63 y.o. male complaining of right shoulder pain for multiple years. Pain had continually increased since the beginning. X-rays in the clinic show end-stage arthritic changes of the right shoulder. Pt has tried various conservative treatments which have failed to alleviate their symptoms, including injections and therapy. Various options are discussed with the patient. Risks, benefits and expectations were discussed with the patient. Patient understand the risks, benefits and expectations and wishes to proceed with surgery.   PCP:  Marvine Rush, MD  D/C Plans: Home  PMH: Past Medical History:  Diagnosis Date   Abnormal gait 03/21/2020   Adjustment disorder with emotional disturbance 08/13/2019   Formatting of this note might be different from the original. 2021   Allergic rhinitis 10/14/2019   Anxiety 10/23/2019   Atrophic kidney 10/24/2019   Formatting of this note might be different from the original. 2021:  CT right hydronephrosis   Back pain    Body mass index (BMI) 32.0-32.9, adult 02/10/2020   Diabetes mellitus due to underlying condition with unspecified complications (HCC) 07/18/2020   Diabetes mellitus without complication (HCC)    Dizziness 10/23/2019   Essential hypertension 10/23/2019   Exotropia, left eye 09/13/2015   Hepatic steatosis 10/24/2019   Formatting of this note might be different from the original. 2021: incidental  on CT     History of tobacco abuse    Hypercalcemia 06/07/2020   Hyperglycemia    Hyperlipidemia 04/07/2020   Current Medications: Lovaza 2g BID, fenofibrate 160mg  daily, Repatha  140mg  subcutaneous q14d  Intolerances: rosuvastatin  10mg  & simvastatin (myalgia)  Risk Factors: DM  LDL-C goal: <70     Hypertension    Hypertriglyceridemia 04/07/2020   Hypokalemia 10/23/2019   Insomnia    Left lumbar radiculitis 02/23/2019   Formatting of this note might be different from the original. 2021: MRI DDD with impingement   Low testosterone level in male    Macular scar of both eyes 09/13/2015   Metabolic acidosis with increased anion gap and accumulation of organic acids 10/23/2019   Osteoarthritis 10/23/2019   Renal insufficiency 06/14/2020   SIRS (systemic inflammatory response syndrome) (HCC) 10/23/2019   Tachycardia 10/23/2019    PSH: Past Surgical History:  Procedure Laterality Date   TONSILLECTOMY      Social History:  reports that he has quit smoking. His smoking use included cigarettes. He has never used smokeless tobacco. He reports current alcohol use. He reports that he does not use drugs. BMI: Estimated body mass index is 30 kg/m as calculated from the following:   Height as of 08/28/23: 5' 5.6 (1.666 m).   Weight as of 08/28/23: 83.3 kg.  Lab Results  Component Value Date   ALBUMIN 4.8 05/12/2020   Diabetes:   Patient has a diagnosis of diabetes,  Lab Results  Component Value Date   HGBA1C 6.9 (H) 03/11/2023   Smoking  Status:      Allergies:  Allergies  Allergen Reactions   Sulfamethoxazole Hives   Triamcinolone Acetonide Hives    Medications: No current facility-administered medications for this encounter.   Current Outpatient Medications  Medication Sig Dispense Refill   acetaminophen  (TYLENOL ) 500 MG tablet Take 500 mg by mouth every 6 (six) hours as needed for moderate pain.     aspirin  EC 81 MG tablet Take 1 tablet (81 mg total) by mouth daily.  Swallow whole. 90 tablet 3   diltiazem  (DILACOR XR ) 240 MG 24 hr capsule Take 240 mg by mouth daily.     Evolocumab  (REPATHA  SURECLICK) 140 MG/ML SOAJ INJECT 140 MG INTO THE SKIN EVERY 14 DAYS 6 mL 1   fenofibrate 160 MG tablet Take 160 mg by mouth daily.     omega-3 acid ethyl esters (LOVAZA) 1 g capsule Take 2 capsules by mouth 2 (two) times daily.     sertraline (ZOLOFT) 100 MG tablet Take 100 mg by mouth daily.     sitaGLIPtin  (JANUVIA ) 50 MG tablet Take 1 tablet (50 mg total) by mouth daily. 90 tablet 0   zolpidem  (AMBIEN  CR) 12.5 MG CR tablet Take 12.5 mg by mouth at bedtime as needed for sleep.      No results found for this or any previous visit (from the past 48 hours). No results found.  ROS: Pain with rom of the right upper extremity  Physical Exam: Alert and oriented 63 y.o. male in no acute distress Cranial nerves 2-12 intact Cervical spine: full rom with no tenderness, nv intact distally Chest: active breath sounds bilaterally, no wheeze rhonchi or rales Heart: regular rate and rhythm, no murmur Abd: non tender non distended with active bowel sounds Hip is stable with rom  Right shoulder painful and weak rom Nv intact distally No rashes or edema  Assessment/Plan Assessment: right shoulder cuff arthropathy  Plan:  Patient will undergo a right reverse total shoulder by Dr. Kay at [facility] Risks benefits and expectations were discussed with the patient. Patient understand risks, benefits and expectations and wishes to proceed. Preoperative templating of the joint replacement has been completed, documented, and submitted to the Operating Room personnel in order to optimize intra-operative equipment management.   Arvella Fireman PA-C, MPAS [facility] is now [employer] [address], [place], [place] [address] Phone: [phone] [email or web address] Facebook  [employer]

## 2023-12-26 NOTE — Progress Notes (Signed)
 Date of COVID positive in last 90 days:  PCP - Norleen General, MD Cardiologist - Jennifer Crape, MD  Cardiac CT- 12/25/22 Epic Chest x-ray - 03/04/23 Epic EKG - 08/28/23 Epic Stress Test - N/A ECHO - 10/23/19 Epic Cardiac Cath - N/A Pacemaker/ICD device last checked:N/A Spinal Cord Stimulator:N/A  Bowel Prep - N/A  Sleep Study - N/A CPAP -   Fasting Blood Sugar - N/A Checks Blood Sugar _____ times a day  Last dose of GLP1 agonist-  N/A GLP1 instructions:  Do not take after     Last dose of SGLT-2 inhibitors-  N/A SGLT-2 instructions:  Do not take after     Blood Thinner Instructions: N/A Last dose:   Time: Aspirin  Instructions: ASA 81 Last Dose:  Activity level:  Can go up a flight of stairs and perform activities of daily living without stopping and without symptoms of chest pain or shortness of breath.  Able to exercise without symptoms  Unable to go up a flight of stairs without symptoms of     Anesthesia review: HTN hepatic steatosis, DM2, SIRS,  Patient denies shortness of breath, fever, cough and chest pain at PAT appointment  Patient verbalized understanding of instructions that were given to them at the PAT appointment. Patient was also instructed that they will need to review over the PAT instructions again at home before surgery.

## 2023-12-28 NOTE — Progress Notes (Signed)
 COVID Vaccine received:  [x]  No []  Yes Date of any COVID positive Test in last 90 days: none  PCP - Norleen General, MD  Cardiologist - Jennifer Crape, MD  Barnie Hila, NP cardiac clearance   Chest x-ray - 03-04-23  2v  Epic EKG -  08-28-2023  Epic Stress Test -  ECHO - 10-23-2019  Epic Cardiac Cath -  CT Coronary Calcium  score: 43.5 on 12-25-2022  Epic  Pacemaker / ICD device [x]  No []  Yes   Spinal Cord Stimulator:[x]  No []  Yes       History of Sleep Apnea? []  No [x]  Yes   CPAP used?- [x]  No []  Yes  doesn't use   Patient has: []  NO Hx DM   [x]  Pre-DM   []  DM1  []   DM2 Does the patient monitor blood sugar?   []  N/A   [x]  No []  Yes  Last A1c was:  6.3 on   11-10-2023    JANUVIA  - Hold x 72 hours  Blood Thinner / Instructions:none Aspirin  Instructions: ASA 81 mg   Comments: Medication Recon: 12-24-23   Medication DOS:  Diltiazem , Fenofibrate, sertraline, tylenol , and Hydrocodone APAP prn.   HOLD: Repatha  injection, Sitagliptin  (Januvia )   Activity level: Able to walk up 2 flights of stairs without becoming significantly short of breath or having chest pain?  []  No   [x]    Yes  Patient can perform ADLs without assistance. []  No   [x]   Yes  Anesthesia review: HTN, Fatty liver, anxiety, Atrophic kidney- CKD3 , Pre-DM, ) ? Cardiac calcium  levels.  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  Patient verbalized understanding and agreement to the Pre-Surgical Instructions that were given to them at this PAT appointment. Patient was also educated of the need to review these PAT instructions again prior to his/her surgery.I reviewed the appropriate phone numbers to call if they have any and questions or concerns.

## 2023-12-28 NOTE — Patient Instructions (Addendum)
 SURGICAL WAITING ROOM VISITATION Patients having surgery or a procedure may have no more than 2 support people in the waiting area - these visitors may rotate in the visitor waiting room.   If the patient needs to stay at the hospital during part of their recovery, the visitor guidelines for inpatient rooms apply.  PRE-OP VISITATION  Pre-op nurse will coordinate an appropriate time for 1 support person to accompany the patient in pre-op.  This support person may not rotate.  This visitor will be contacted when the time is appropriate for the visitor to come back in the pre-op area.  Please refer to the [facility] website for the visitor guidelines for Inpatients (after your surgery is over and you are in a regular room).  You are not required to quarantine at this time prior to your surgery. However, you must do this: Hand Hygiene often Do NOT share personal items Notify your provider if you are in close contact with someone who has COVID or you develop fever 100.4 or greater, new onset of sneezing, cough, sore throat, shortness of breath or body aches.  If you test positive for Covid or have been in contact with anyone that has tested positive in the last 10 days please notify you surgeon.    Your procedure is scheduled on:  FRIDAY  December 01-09-2024  Report to [facility] Main Entrance: [address] entrance where the [employer] is available.   Report to admitting at: 11:30    AM  Call this number if you have any questions or problems the morning of surgery [phone]  Do not eat food after Midnight the night prior to your surgery/procedure.  After Midnight you may have the following liquids until  11:00  AM DAY OF SURGERY  Clear Liquid Diet Water Black Coffee (sugar ok, NO MILK/CREAM OR CREAMERS)  Tea (sugar ok, NO MILK/CREAM OR CREAMERS) regular and decaf                             Plain Jell-O  with no fruit (NO RED)                                           Fruit  ices (not with fruit pulp, NO RED)                                     Popsicles (NO RED)                                                                  Juice: NO CITRUS JUICES: only apple, WHITE grape, WHITE cranberry Sports drinks like Gatorade or Powerade (NO RED)                   The day of surgery:  Drink ONE (1) Pre-Surgery G2 at   11:00  AM the morning of surgery. Drink in one sitting. Do not sip.  This drink was given to you during your hospital pre-op appointment visit. Nothing else to drink after  completing the Pre-Surgery  G2 : No candy, chewing gum or throat lozenges.    FOLLOW ANY ADDITIONAL PRE OP INSTRUCTIONS YOU RECEIVED FROM YOUR SURGEON'S OFFICE!!!   Oral Hygiene is also important to reduce your risk of infection.        Remember - BRUSH YOUR TEETH THE MORNING OF SURGERY WITH YOUR REGULAR TOOTHPASTE  Do NOT smoke after Midnight the night before surgery.  STOP TAKING all Vitamins, Herbs and supplements 1 week before your surgery.  ASPIRIN -  Stop taking   1 week before you surgery.   REPATHA  injection: stop injections at least 1 week before surgery  JANUVIA -  Stop taking 72 hours before surgery. Last dose will be taken on Monday 01-05-2024  Take ONLY these medicines the morning of surgery with A SIP OF WATER: Diltiazem , Fenofibrate, sertraline, tylenol , and Hydrocodone APAP prn.   If You have been diagnosed with Sleep Apnea - Bring CPAP mask and tubing day of surgery. We will provide you with a CPAP machine on the day of your surgery.                   You may not have any metal on your body including  jewelry, and body piercing  Do not wear  lotions, powders,  cologne, or deodorant  Men may shave face and neck.  Contacts, Hearing Aids, dentures or bridgework may not be worn into surgery. DENTURES WILL BE REMOVED PRIOR TO SURGERY PLEASE DO NOT APPLY Poly grip OR ADHESIVES!!!  You may bring a small overnight bag with you on the day of surgery, only pack  items that are not valuable. [facility] IS NOT RESPONSIBLE   FOR VALUABLES THAT ARE LOST OR STOLEN.   Do not bring your home medications to the hospital. The Pharmacy will dispense medications listed on your medication list to you during your admission in the Hospital.  Special Instructions: Bring a copy of your healthcare power of attorney and living will documents the day of surgery, if you wish to have them scanned into your [facility] Records- EPIC  Please read over the following fact sheets you were given: IF YOU HAVE QUESTIONS ABOUT YOUR PRE-OP INSTRUCTIONS, PLEASE CALL [phone].      Pre-operative 4 CHG Bath Instructions   You can play a key role in reducing the risk of infection after surgery. Your skin needs to be as free of germs as possible. You can reduce the number of germs on your skin by washing with CHG (chlorhexidine gluconate) soap before surgery. CHG is an antiseptic soap that kills germs and continues to kill germs even after washing.   DO NOT use if you have an allergy to chlorhexidine/CHG or antibacterial soaps. If your skin becomes reddened or irritated, stop using the CHG and notify one of our RNs at   Please shower with the CHG soap starting 4 days before surgery using the following schedule:   MONDAY  January 05, 2024     Do NOT use CHG soap  the morning of your                                                                                                                                 surgery.         Please keep in mind the following:  DO NOT shave, including legs and underarms, starting the day of your first shower.   You may shave your face at any point before/day of surgery.  Place clean sheets on your bed the day you start using CHG soap. Use a clean washcloth (not used since being washed) for each shower. DO NOT sleep with  pets once you start using the CHG.  CHG Shower Instructions:  If you choose to wash your hair and private area, wash first with your normal shampoo/soap.  After you use shampoo/soap, rinse your hair and body thoroughly to remove shampoo/soap residue.  Turn the water OFF and apply about 3 tablespoons (45 ml) of CHG soap to a CLEAN washcloth.  Apply CHG soap ONLY FROM YOUR NECK DOWN TO YOUR TOES (washing for 3-5 minutes)  DO NOT use CHG soap on face, private areas, open wounds, or sores.  Pay special attention to the area where your surgery is being performed.  If you are having back surgery, having someone wash your back for you may be helpful. Wait 2 minutes after CHG soap is applied, then you may rinse off the CHG soap.  Pat dry with a clean towel  Put on clean clothes/pajamas   If you choose to wear lotion, please use ONLY the CHG-compatible lotions on the back of this paper.     Additional instructions for the day of surgery: DO NOT APPLY any CHG Soap,  lotions, deodorants, cologne, or perfumes on the day of surgery  Put on clean/comfortable clothes.  Brush your teeth.  Ask your nurse before applying any prescription medications to the skin.   CHG Compatible Lotions   Aveeno Moisturizing lotion  Cetaphil Moisturizing Cream  Cetaphil Moisturizing Lotion  Clairol Herbal Essence Moisturizing Lotion, Dry Skin  Clairol Herbal Essence Moisturizing Lotion, Extra Dry Skin  Clairol Herbal Essence Moisturizing Lotion, Normal Skin  Curel Age Defying Therapeutic Moisturizing Lotion with Alpha Hydroxy  Curel Extreme Care Body Lotion  Curel Soothing Hands Moisturizing Hand Lotion  Curel Therapeutic Moisturizing Cream, Fragrance-Free  Curel Therapeutic Moisturizing Lotion, Fragrance-Free  Curel Therapeutic Moisturizing Lotion, Original Formula  Eucerin Daily Replenishing Lotion  Eucerin Dry Skin Therapy Plus Alpha Hydroxy Crme  Eucerin Dry Skin Therapy Plus Alpha Hydroxy Lotion  Eucerin  Original Crme  Eucerin Original Lotion  Eucerin Plus Crme Eucerin Plus Lotion  Eucerin TriLipid Replenishing Lotion  Keri Anti-Bacterial Hand Lotion  Keri Deep Conditioning Original Lotion Dry Skin Formula Softly Scented  Keri Deep Conditioning Original Lotion, Fragrance Free Sensitive Skin Formula  Keri Lotion Fast Absorbing Fragrance Free Sensitive Skin Formula  Keri Lotion Fast Absorbing Softly Scented Dry Skin  Formula  Keri Original Lotion  Keri Skin Renewal Lotion Keri Silky Smooth Lotion  Keri Silky Smooth Sensitive Skin Lotion  Nivea Body Creamy Conditioning Oil  Nivea Body Extra Enriched Lotion  Nivea Body Original Lotion  Nivea Body Sheer Moisturizing Lotion Nivea Crme  Nivea Skin Firming Lotion  NutraDerm 30 Skin Lotion  NutraDerm Skin Lotion  NutraDerm Therapeutic Skin Cream  NutraDerm Therapeutic Skin Lotion  ProShield Protective Hand Cream  Provon moisturizing lotion      Preparing for Total Shoulder Arthroplasty ================================================================= Please follow these instructions carefully, in addition to any other special Bathing information that was explained to you at the Presurgical Appointment:  BENZOYL PEROXIDE 5% GEL: Used to kill bacteria on the skin which could cause an infection at the surgery site.   Please do not use if you have an allergy to benzoyl peroxide. If your skin becomes reddened/irritated stop using the benzoyl peroxide and inform your Doctor.   Starting two days before surgery, apply as follows:  1. Apply benzoyl peroxide gel in the morning and at night. Apply after taking a shower. If you are not taking a shower, clean entire shoulder front, back, and side, along with the armpit with a clean wet washcloth.  2. Place a quarter-sized dollop of the gel on your SHOULDER and rub in thoroughly, making sure to cover the front, back, and side of your shoulder, along with the armpit.   2 Days prior to Surgery     [facility]  01-07-2024 First Application _______ Morning Second Application _______ Night  Day Before Surgery      THURSDAY  01-08-2024 First Application______ Morning  On the night before surgery, wash your entire body (except hair, face and private areas) with CHG Soap. THEN, rub in the LAST application of the Benzoyl Peroxide Gel on your shoulder.   3.   DO NOT USE THE BENZOYL PEROXIDE GEL OR CHG SOAP ON THE DAY OF YOUR SURGERY      FAILURE TO FOLLOW THESE INSTRUCTIONS MAY RESULT IN THE CANCELLATION OF YOUR SURGERY  PATIENT SIGNATURE_________________________________  NURSE SIGNATURE__________________________________  ________________________________________________________________________       David Gill    An incentive spirometer is a tool that can help keep your lungs clear and active. This tool measures how well you are filling your lungs with each breath. Taking long deep breaths may help reverse or decrease the chance of developing breathing (pulmonary) problems (especially infection) following: A long period of time when you are unable to move or be active. BEFORE THE PROCEDURE  If the spirometer includes an indicator to show your best effort, your nurse or respiratory therapist will set it to a desired goal. If possible, sit up straight or lean slightly forward. Try not to slouch. Hold the incentive spirometer in an upright position. INSTRUCTIONS FOR USE  Sit on the edge of your bed if possible, or sit up as far as you can in bed or on a chair. Hold the incentive spirometer in an upright position. Breathe out normally. Place the mouthpiece in your mouth and seal your lips tightly around it. Breathe in slowly and as deeply as possible, raising the piston or the ball toward the top of the column. Hold your breath for 3-5 seconds or for as long as possible. Allow the piston or ball to fall to the bottom of the column. Remove the mouthpiece from your mouth and  breathe out normally. Rest for a few seconds and repeat Steps 1 through 7 at least  10 times every 1-2 hours when you are awake. Take your time and take a few normal breaths between deep breaths. The spirometer may include an indicator to show your best effort. Use the indicator as a goal to work toward during each repetition. After each set of 10 deep breaths, practice coughing to be sure your lungs are clear. If you have an incision (the cut made at the time of surgery), support your incision when coughing by placing a pillow or rolled up towels firmly against it. Once you are able to get out of bed, walk around indoors and cough well. You may stop using the incentive spirometer when instructed by your caregiver.  RISKS AND COMPLICATIONS Take your time so you do not get dizzy or light-headed. If you are in pain, you may need to take or ask for pain medication before doing incentive spirometry. It is harder to take a deep breath if you are having pain. AFTER USE Rest and breathe slowly and easily. It can be helpful to keep track of a log of your progress. Your caregiver can provide you with a simple table to help with this. If you are using the spirometer at home, follow these instructions: SEEK MEDICAL CARE IF:  You are having difficultly using the spirometer. You have trouble using the spirometer as often as instructed. Your pain medication is not giving enough relief while using the spirometer. You develop fever of 100.5 F (38.1 C) or higher.                                                                                                    SEEK IMMEDIATE MEDICAL CARE IF:  You cough up bloody sputum that had not been present before. You develop fever of 102 F (38.9 C) or greater. You develop worsening pain at or near the incision site. MAKE SURE YOU:  Understand these instructions. Will watch your condition. Will get help right away if you are not doing well or get worse. Document  Released: 06/03/2006 Document Revised: 04/15/2011 Document Reviewed: 08/04/2006 [facility] Patient Information 2014 [place], [place].      If you would like to see a video about joint replacement:   [email or web address]

## 2023-12-29 ENCOUNTER — Encounter (HOSPITAL_COMMUNITY): Payer: Self-pay

## 2023-12-29 ENCOUNTER — Other Ambulatory Visit: Payer: Self-pay

## 2023-12-29 ENCOUNTER — Encounter (HOSPITAL_COMMUNITY)
Admission: RE | Admit: 2023-12-29 | Discharge: 2023-12-29 | Disposition: A | Discharge status: Home / Self Care | Source: Ambulatory Visit | Attending: Orthopedic Surgery | Admitting: Orthopedic Surgery

## 2023-12-29 VITALS — BP 156/80 | HR 85 | Temp 98.7°F | Resp 16 | Ht 66.0 in | Wt 184.0 lb

## 2023-12-29 DIAGNOSIS — Z79899 Other long term (current) drug therapy: Secondary | ICD-10-CM | POA: Diagnosis not present

## 2023-12-29 DIAGNOSIS — N261 Atrophy of kidney (terminal): Secondary | ICD-10-CM | POA: Insufficient documentation

## 2023-12-29 DIAGNOSIS — Z01818 Encounter for other preprocedural examination: Secondary | ICD-10-CM | POA: Diagnosis present

## 2023-12-29 DIAGNOSIS — M19011 Primary osteoarthritis, right shoulder: Secondary | ICD-10-CM | POA: Diagnosis not present

## 2023-12-29 DIAGNOSIS — Z01812 Encounter for preprocedural laboratory examination: Secondary | ICD-10-CM | POA: Insufficient documentation

## 2023-12-29 HISTORY — DX: Gastro-esophageal reflux disease without esophagitis: K21.9

## 2023-12-29 HISTORY — DX: Sleep apnea, unspecified: G47.30

## 2023-12-29 HISTORY — DX: Malignant (primary) neoplasm, unspecified: C80.1

## 2023-12-29 HISTORY — DX: Pneumonia, unspecified organism: J18.9

## 2023-12-29 HISTORY — DX: Prediabetes: R73.03

## 2023-12-29 LAB — COMPREHENSIVE METABOLIC PANEL WITH GFR
ALT: 79 U/L — ABNORMAL HIGH (ref 0–44)
AST: 49 U/L — ABNORMAL HIGH (ref 15–41)
Albumin: 4.6 g/dL (ref 3.5–5.0)
Alkaline Phosphatase: 63 U/L (ref 38–126)
Anion gap: 14 (ref 5–15)
BUN: 15 mg/dL (ref 8–23)
CO2: 21 mmol/L — ABNORMAL LOW (ref 22–32)
Calcium: 10.2 mg/dL (ref 8.9–10.3)
Chloride: 102 mmol/L (ref 98–111)
Creatinine, Ser: 1.27 mg/dL — ABNORMAL HIGH (ref 0.61–1.24)
GFR, Estimated: >60 mL/min (ref >60)
Glucose, Bld: 116 mg/dL — ABNORMAL HIGH (ref 70–99)
Potassium: 4.6 mmol/L (ref 3.5–5.1)
Sodium: 137 mmol/L (ref 135–145)
Total Bilirubin: 0.5 mg/dL (ref 0.0–1.2)
Total Protein: 7.9 g/dL (ref 6.5–8.1)

## 2023-12-29 LAB — CBC
HCT: 50.2 % (ref 39.0–52.0)
Hemoglobin: 17 g/dL (ref 13.0–17.0)
MCH: 29.8 pg (ref 26.0–34.0)
MCHC: 33.9 g/dL (ref 30.0–36.0)
MCV: 87.9 fL (ref 80.0–100.0)
Platelets: 354 K/uL (ref 150–400)
RBC: 5.71 MIL/uL (ref 4.22–5.81)
RDW: 12.6 % (ref 11.5–15.5)
WBC: 11.3 K/uL — ABNORMAL HIGH (ref 4.0–10.5)
nRBC: 0 % (ref 0.0–0.2)

## 2023-12-29 LAB — SURGICAL PCR SCREEN
MRSA, PCR: NEGATIVE
Staphylococcus aureus: NEGATIVE

## 2024-02-18 NOTE — Progress Notes (Signed)
 David Gill                                          MRN: [id number]   02/18/2024   The VBCI Quality Team Specialist reviewed this patient medical record for the purposes of chart review for care gap closure. The following were reviewed: chart review for care gap closure-kidney health evaluation for diabetes:eGFR  and uACR.    VBCI Quality Team

## 2024-02-25 NOTE — H&P (Signed)
 " Patient's anticipated LOS is less than 2 midnights, meeting these requirements: - Younger than 28 - Lives within 1 hour of care - Has a competent adult at home to recover with post-op recover - NO history of  - Chronic pain requiring opiods  - Diabetes  - Coronary Artery Disease  - Heart failure  - Heart attack  - Stroke  - DVT/VTE  - Cardiac arrhythmia  - Respiratory Failure/COPD  - Renal failure  - Anemia  - Advanced Liver disease     David Gill is an 64 y.o. male.    Chief Complaint: right shoulder pain  HPI: Pt is a 64 y.o. male complaining of right shoulder pain for multiple years. Pain had continually increased since the beginning. X-rays in the clinic show end-stage arthritic changes of the right shoulder. Pt has tried various conservative treatments which have failed to alleviate their symptoms, including injections and therapy. Various options are discussed with the patient. Risks, benefits and expectations were discussed with the patient. Patient understand the risks, benefits and expectations and wishes to proceed with surgery.   PCP:  Marvine Rush, MD  D/C Plans: Home  PMH: Past Medical History:  Diagnosis Date   Abnormal gait 03/21/2020   Adjustment disorder with emotional disturbance 08/13/2019   Formatting of this note might be different from the original. 2021   Allergic rhinitis 10/14/2019   Anxiety 10/23/2019   Atrophic kidney 10/24/2019   Formatting of this note might be different from the original. 2021:  CT right hydronephrosis   Back pain    Body mass index (BMI) 32.0-32.9, adult 02/10/2020   Cancer (HCC)    skin cancers   Dizziness 10/23/2019   Essential hypertension 10/23/2019   Exotropia, left eye 09/13/2015   GERD (gastroesophageal reflux disease)    Hepatic steatosis 10/24/2019   Formatting of this note might be different from the original. 2021: incidental on CT     History of tobacco abuse    Hypercalcemia 06/07/2020    Hyperlipidemia 04/07/2020   Current Medications: Lovaza 2g BID, fenofibrate 160mg  daily, Repatha  140mg  subcutaneous q14d  Intolerances: rosuvastatin  10mg  & simvastatin (myalgia)  Risk Factors: DM  LDL-C goal: <70     Hypertension    Hypertriglyceridemia 04/07/2020   Hypokalemia 10/23/2019   Insomnia    Left lumbar radiculitis 02/23/2019   Formatting of this note might be different from the original. 2021: MRI DDD with impingement   Low testosterone level in male    Macular scar of both eyes 09/13/2015   Metabolic acidosis with increased anion gap and accumulation of organic acids 10/23/2019   Osteoarthritis 10/23/2019   Pneumonia    Pre-diabetes    Renal insufficiency 06/14/2020   SIRS (systemic inflammatory response syndrome) (HCC) 10/23/2019   Sleep apnea    no CPAP   Tachycardia 10/23/2019    PSH: Past Surgical History:  Procedure Laterality Date   NO PAST SURGERIES     TONSILLECTOMY      Social History:  reports that he has quit smoking. His smoking use included cigarettes. He has never used smokeless tobacco. He reports current alcohol use. He reports that he does not use drugs. BMI: Estimated body mass index is 29.7 kg/m as calculated from the following:   Height as of 12/29/23: 5' 6 (1.676 m).   Weight as of 12/29/23: 83.5 kg.  Lab Results  Component Value Date   ALBUMIN 4.6 12/29/2023   Diabetes:   Patient has a diagnosis of diabetes,  Lab Results  Component Value Date   HGBA1C 6.9 (H) 03/11/2023   Smoking Status:      Allergies:  Allergies[1]  Medications: No current facility-administered medications for this encounter.   Current Outpatient Medications  Medication Sig Dispense Refill   acetaminophen  (TYLENOL ) 500 MG tablet Take 500 mg by mouth every 6 (six) hours as needed for moderate pain.     aspirin  EC 81 MG tablet Take 1 tablet (81 mg total) by mouth daily. Swallow whole. 90 tablet 3   diltiazem  (DILACOR XR ) 240 MG 24 hr capsule Take 240 mg by  mouth daily.     Evolocumab  (REPATHA  SURECLICK) 140 MG/ML SOAJ INJECT 140 MG INTO THE SKIN EVERY 14 DAYS 6 mL 1   fenofibrate 160 MG tablet Take 160 mg by mouth daily.     HYDROcodone-acetaminophen  (NORCO/VICODIN) 5-325 MG tablet Take 1 tablet by mouth every 6 (six) hours.     omega-3 acid ethyl esters (LOVAZA) 1 g capsule Take 2 capsules by mouth 2 (two) times daily.     sertraline (ZOLOFT) 100 MG tablet Take 100 mg by mouth daily.     sitaGLIPtin  (JANUVIA ) 50 MG tablet Take 1 tablet (50 mg total) by mouth daily. 90 tablet 0   triamcinolone  cream (KENALOG) 0.1 % Apply 1 Application topically daily as needed (Rash).     zolpidem  (AMBIEN  CR) 12.5 MG CR tablet Take 12.5 mg by mouth at bedtime.      No results found for this or any previous visit (from the past 48 hours). No results found.  ROS: Pain with rom of the right upper extremity  Physical Exam: Alert and oriented 64 y.o. male in no acute distress Cranial nerves 2-12 intact Cervical spine: full rom with no tenderness, nv intact distally Chest: active breath sounds bilaterally, no wheeze rhonchi or rales Heart: regular rate and rhythm, no murmur Abd: non tender non distended with active bowel sounds Hip is stable with rom  Right shoulder painful and weak rom Nv intact distally   Assessment/Plan Assessment: right shoulder cuff arthropathy   Plan:  Patient will undergo a right reverse total shoulder by Dr. Kay at [facility] benefits and expectations were discussed with the patient. Patient understand risks, benefits and expectations and wishes to proceed. Preoperative templating of the joint replacement has been completed, documented, and submitted to the Operating Room personnel in order to optimize intra-operative equipment management.   Arvella Fireman PA-C, MPAS [facility] is now [employer] [address], [address], [place], [place] [address] Phone:  [phone] [email or web address] Facebook  Instagram  LinkedIn  Twitter        [1]  Allergies Allergen Reactions   Sulfamethoxazole Hives   Triamcinolone  Acetonide Hives   "

## 2024-03-11 ENCOUNTER — Encounter (HOSPITAL_COMMUNITY)
Admission: RE | Admit: 2024-03-11 | Discharge: 2024-03-11 | Disposition: A | Source: Ambulatory Visit | Attending: Orthopedic Surgery

## 2024-03-11 ENCOUNTER — Encounter (HOSPITAL_COMMUNITY): Payer: Self-pay

## 2024-03-11 ENCOUNTER — Other Ambulatory Visit: Payer: Self-pay

## 2024-03-11 VITALS — BP 159/87 | HR 78 | Temp 98.5°F | Resp 16 | Ht 65.5 in | Wt 180.0 lb

## 2024-03-11 DIAGNOSIS — Z01818 Encounter for other preprocedural examination: Secondary | ICD-10-CM

## 2024-03-11 LAB — BASIC METABOLIC PANEL WITH GFR
Anion gap: 11 (ref 5–15)
BUN: 16 mg/dL (ref 8–23)
CO2: 26 mmol/L (ref 22–32)
Calcium: 10.3 mg/dL (ref 8.9–10.3)
Chloride: 102 mmol/L (ref 98–111)
Creatinine, Ser: 1.26 mg/dL — ABNORMAL HIGH (ref 0.61–1.24)
GFR, Estimated: >60 mL/min
Glucose, Bld: 108 mg/dL — ABNORMAL HIGH (ref 70–99)
Potassium: 4.3 mmol/L (ref 3.5–5.1)
Sodium: 139 mmol/L (ref 135–145)

## 2024-03-11 LAB — CBC
HCT: 48.4 % (ref 39.0–52.0)
Hemoglobin: 16.3 g/dL (ref 13.0–17.0)
MCH: 29.7 pg (ref 26.0–34.0)
MCHC: 33.7 g/dL (ref 30.0–36.0)
MCV: 88.2 fL (ref 80.0–100.0)
Platelets: 356 10*3/uL (ref 150–400)
RBC: 5.49 MIL/uL (ref 4.22–5.81)
RDW: 12.3 % (ref 11.5–15.5)
WBC: 11.8 10*3/uL — ABNORMAL HIGH (ref 4.0–10.5)
nRBC: 0 % (ref 0.0–0.2)

## 2024-03-11 LAB — GLUCOSE, CAPILLARY: Glucose-Capillary: 110 mg/dL — ABNORMAL HIGH (ref 70–99)

## 2024-03-11 LAB — SURGICAL PCR SCREEN
MRSA, PCR: NEGATIVE
Staphylococcus aureus: NEGATIVE

## 2024-03-11 LAB — HEMOGLOBIN A1C
Hgb A1c MFr Bld: 6.2 % — ABNORMAL HIGH (ref 4.8–5.6)
Mean Plasma Glucose: 131.24 mg/dL

## 2024-03-19 ENCOUNTER — Encounter (HOSPITAL_COMMUNITY): Admission: RE | Payer: Self-pay | Source: Ambulatory Visit

## 2024-03-19 ENCOUNTER — Ambulatory Visit (HOSPITAL_COMMUNITY): Admission: RE | Admit: 2024-03-19 | Source: Ambulatory Visit | Admitting: Orthopedic Surgery

## 2024-03-19 ENCOUNTER — Encounter (HOSPITAL_COMMUNITY): Payer: Self-pay | Admitting: Physician Assistant

## 2024-03-19 ENCOUNTER — Encounter (HOSPITAL_COMMUNITY): Payer: Self-pay

## 2024-03-19 SURGERY — ARTHROPLASTY, SHOULDER, TOTAL, REVERSE
Anesthesia: Choice | Site: Shoulder | Laterality: Right
